# Patient Record
Sex: Female | Born: 1985 | Hispanic: No | Marital: Married | Smoking: Former smoker
Health system: Southern US, Community
[De-identification: ages and names within clinical notes are randomized; demographics above are authoritative.]

## PROBLEM LIST (undated history)

## (undated) DIAGNOSIS — A64 Unspecified sexually transmitted disease: Secondary | ICD-10-CM

## (undated) DIAGNOSIS — IMO0002 Reserved for concepts with insufficient information to code with codable children: Secondary | ICD-10-CM

## (undated) HISTORY — PX: INTRAUTERINE DEVICE (IUD) INSERTION: SHX5877

## (undated) HISTORY — DX: Reserved for concepts with insufficient information to code with codable children: IMO0002

## (undated) HISTORY — DX: Unspecified sexually transmitted disease: A64

---

## 2008-12-23 DIAGNOSIS — IMO0002 Reserved for concepts with insufficient information to code with codable children: Secondary | ICD-10-CM

## 2008-12-23 HISTORY — DX: Reserved for concepts with insufficient information to code with codable children: IMO0002

## 2009-02-10 HISTORY — PX: COLPOSCOPY: SHX161

## 2012-10-29 ENCOUNTER — Ambulatory Visit (INDEPENDENT_AMBULATORY_CARE_PROVIDER_SITE_OTHER): Payer: 59 | Admitting: Certified Nurse Midwife

## 2012-10-29 ENCOUNTER — Encounter: Payer: Self-pay | Admitting: Certified Nurse Midwife

## 2012-10-29 VITALS — BP 94/60 | HR 64 | Temp 98.1°F | Resp 16 | Ht 67.25 in | Wt 150.0 lb

## 2012-10-29 DIAGNOSIS — R350 Frequency of micturition: Secondary | ICD-10-CM

## 2012-10-29 LAB — POCT URINALYSIS DIPSTICK
Bilirubin, UA: NEGATIVE
Glucose, UA: NEGATIVE
Nitrite, UA: NEGATIVE
Protein, UA: NEGATIVE
Urobilinogen, UA: NEGATIVE
pH, UA: 5

## 2012-10-29 MED ORDER — PHENAZOPYRIDINE HCL 200 MG PO TABS: 200.0000 mg | ORAL_TABLET | Freq: Three times a day (TID) | ORAL | Status: DC | PRN

## 2012-10-29 NOTE — Progress Notes (Signed)
S:  27 y.o.Married Caucasian female presents with UTI  symptoms of urinary frequency, "white flakes in urine" for approximately 2 weeks. Pertinent negatives include The patient is having no constitutional symptoms, denying fever, chills, anorexia, or weight loss..  Symptoms not related to post coital. Current method of birth control Skyla IUD. Patient denies vaginal itching, burning or discharge. Patient has changed soap and is drinking 3 cups of caffeine daily and green tea, some water.  ROS: feels well  O alert, oriented to person, place, and time   healthy,  alert, with normal affect, well developed and well nourished  Skin warm and dry  CVAT negative bilateral  No CVA tenderness,   Pelvic exam: External genital area: BUS negative, bladder not tender, urethral meatus red, not tender Vagina: scant blood present, normal Cervix: Non tender, normal, IUD string noted Uterus: normal, non tender Adnexa: normal, non tender, no masses  POCT urine: negative    Assessment: Urinary frequency ? Related to caffeine intake or change in personal product.? urethritis Normal pelvic exam  Plan: Reviewed findings.Decrease caffeine intake and increase water intake. Lab: Urine culture Rx Pyridium see order  Rv prn

## 2012-10-31 ENCOUNTER — Telehealth: Payer: Self-pay

## 2012-10-31 NOTE — Telephone Encounter (Signed)
lmtcb

## 2012-10-31 NOTE — Telephone Encounter (Signed)
Patient notified of results.  See lab results °

## 2012-10-31 NOTE — Telephone Encounter (Signed)
Message copied by Eliezer Bottom on Wed Oct 31, 2012 11:43 AM ------      Message from: Verner Chol      Created: Wed Oct 31, 2012  8:37 AM       Notify patient urine culture negative      Patient status ------

## 2012-11-01 NOTE — Progress Notes (Signed)
Note reviewed, agree with plan.  Ruweyda Macknight, MD  

## 2013-05-20 ENCOUNTER — Ambulatory Visit (INDEPENDENT_AMBULATORY_CARE_PROVIDER_SITE_OTHER): Payer: 59 | Admitting: Certified Nurse Midwife

## 2013-05-20 ENCOUNTER — Encounter: Payer: Self-pay | Admitting: Certified Nurse Midwife

## 2013-05-20 VITALS — BP 114/62 | HR 70 | Resp 16 | Ht 67.25 in | Wt 161.0 lb

## 2013-05-20 DIAGNOSIS — Z0000 Encounter for general adult medical examination without abnormal findings: Secondary | ICD-10-CM

## 2013-05-20 DIAGNOSIS — Z124 Encounter for screening for malignant neoplasm of cervix: Secondary | ICD-10-CM

## 2013-05-20 DIAGNOSIS — Z01419 Encounter for gynecological examination (general) (routine) without abnormal findings: Secondary | ICD-10-CM

## 2013-05-20 LAB — POCT URINALYSIS DIPSTICK
BILIRUBIN UA: NEGATIVE
Blood, UA: NEGATIVE
GLUCOSE UA: NEGATIVE
Ketones, UA: NEGATIVE
Leukocytes, UA: NEGATIVE
NITRITE UA: NEGATIVE
Protein, UA: NEGATIVE
UROBILINOGEN UA: NEGATIVE
pH, UA: 5

## 2013-05-20 NOTE — Progress Notes (Signed)
28 y.o. [id number] Married Caucasian Fe here for annual exam. Periods scant to none with Skyla IUD. Happy with choice. Occasional cramping but no problems, relieved with OTC Motrin. No health issues, working on weight loss and exercise.    No LMP recorded. Patient is not currently having periods (Reason: IUD).          Sexually active: yes  The current method of family planning is IUD.    Exercising: yes  cardio & weight training Smoker:  no  Health Maintenance: Pap:  12-20-11 neg MMG:  none Colonoscopy: none BMD:   none TDaP: 2011 Labs: Poct urine-neg Self breast exam:done occ   reports that she has quit smoking. She does not have any smokeless tobacco history on file. She reports that she drinks about 3 - 5 ounces of alcohol per week. She reports that she does not use illicit drugs.  Past Medical History  Diagnosis Date  . 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 STD (sexually transmitted disease)     condyloma    Past Surgical History  Procedure Laterality Date  . Intrauterine device (iud) insertion      skyla inserted 01/02/12  . 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 Ascorbic Acid (VITAMIN C PO) Take by mouth daily.      Marland Kitchen. BIOTIN PO Take by mouth daily.      . cetirizine (ZYRTEC) 10 MG tablet Take 10 mg by mouth daily.      . Levonorgestrel (SKYLA) 13.5 MG IUD by Intrauterine route.       No current facility-administered medications for this visit.    Family History  Problem Relation Age of Onset  . Cancer Mother     cervical  . Hypertension Father   . Stroke Paternal Grandmother     ROS:  Pertinent items are noted in HPI.  Otherwise, a comprehensive ROS was negative.  Exam:   BP 114/62  Pulse 70  Resp 16  Ht 5' 7.25" (1.708 m)  Wt 161 lb (73.029 kg)  BMI 25.03 kg/m2 Height: 5' 7.25" (170.8 cm)  Ht Readings from Last 3 Encounters:  05/20/13 5' 7.25" (1.708 m)  10/29/12 5' 7.25" (1.708 m)     General appearance: alert, cooperative and appears stated age Head: Normocephalic, without obvious abnormality, atraumatic Neck: no adenopathy, supple, symmetrical, trachea midline and thyroid normal to inspection and palpation and non-palpable Lungs: clear to auscultation bilaterally Breasts: normal appearance, no masses or tenderness, No nipple retraction or dimpling, No nipple discharge or bleeding, No axillary or supraclavicular adenopathy Heart: regular rate and rhythm Abdomen: soft, non-tender; no masses,  no organomegaly Extremities: extremities normal, atraumatic, no cyanosis or edema Skin: Skin color, texture, turgor normal. No rashes or lesions Lymph nodes: Cervical, supraclavicular, and axillary nodes normal. No abnormal inguinal nodes palpated Neurologic: Grossly normal   Pelvic: External genitalia:  no lesions              Urethra:  normal appearing urethra with no masses, tenderness or lesions              Bartholin's and Skene's: normal                 Vagina: normal appearing vagina with normal color and discharge, no lesions              Cervix: normal, not tender, IUD string noted  Pap taken: yes Bimanual Exam:  Uterus:  normal size, contour, position, consistency, mobility, non-tender and anteflexed              Adnexa: normal adnexa and no mass, fullness, tenderness               Rectovaginal: Confirms               Anus:  deferred  A:  Well Woman with normal exam  Contraception Skyla IUD  P:   Reviewed health and wellness pertinent to exam  Aware of warning signs of IUD, due for removal 12/31/14.  Pap smear taken today with HPV reflex    counseled on breast self exam, adequate intake of calcium and vitamin D, diet and exercise  return annually or prn  An After Visit Summary was printed and given to the patient.

## 2013-05-20 NOTE — Patient Instructions (Signed)
General topics  Next pap or exam is  due in 1 year Take a Women's multivitamin Take 1200 mg. of calcium daily - prefer dietary If any concerns in interim to call back  Breast Self-Awareness Practicing breast self-awareness may pick up problems early, prevent significant medical complications, and possibly save your life. By practicing breast self-awareness, you can become familiar with how your breasts look and feel and if your breasts are changing. This allows you to notice changes early. It can also offer you some reassurance that your breast health is good. One way to learn what is normal for your breasts and whether your breasts are changing is to do a breast self-exam. If you find a lump or something that was not present in the past, it is best to contact your caregiver right away. Other findings that should be evaluated by your caregiver include nipple discharge, especially if it is bloody; skin changes or reddening; areas where the skin seems to be pulled in (retracted); or new lumps and bumps. Breast pain is seldom associated with cancer (malignancy), but should also be evaluated by a caregiver. BREAST SELF-EXAM The best time to examine your breasts is 5 7 days after your menstrual period is over.  ExitCare Patient Information 2013 [place].   Exercise to Stay Healthy Exercise helps you become and stay healthy. EXERCISE IDEAS AND TIPS Choose exercises that:  You enjoy.  Fit into your day. You do not need to exercise really hard to be healthy. You can do exercises at a slow or medium level and stay healthy. You can:  Stretch before and after working out.  Try yoga, Pilates, or tai chi.  Lift weights.  Walk fast, swim, jog, run, climb stairs, bicycle, dance, or rollerskate.  Take aerobic classes. Exercises that burn about 150 calories:  Running 1  miles in 15 minutes.  Playing volleyball for 45 to 60 minutes.  Washing and waxing a car for 45 to 60  minutes.  Playing touch football for 45 minutes.  Walking 1  miles in 35 minutes.  Pushing a stroller 1  miles in 30 minutes.  Playing basketball for 30 minutes.  Raking leaves for 30 minutes.  Bicycling 5 miles in 30 minutes.  Walking 2 miles in 30 minutes.  Dancing for 30 minutes.  Shoveling snow for 15 minutes.  Swimming laps for 20 minutes.  Walking up stairs for 15 minutes.  Bicycling 4 miles in 15 minutes.  Gardening for 30 to 45 minutes.  Jumping rope for 15 minutes.  Washing windows or floors for 45 to 60 minutes. Document Released: 02/05/2010 Document Revised: 03/28/2011 Document Reviewed: 02/05/2010 [facility] Patient Information 2013 [place].   Other topics ( that may be useful information):    Sexually Transmitted Disease Sexually transmitted disease (STD) refers to any infection that is passed from person to person during sexual activity. This may happen by way of saliva, semen, blood, vaginal mucus, or urine. Common STDs include:  Gonorrhea.  Chlamydia.  Syphilis.  HIV/AIDS.  Genital herpes.  Hepatitis B and C.  Trichomonas.  Human papillomavirus (HPV).  Pubic lice. CAUSES  An STD may be spread by bacteria, virus, or parasite. A person can get an STD by:  Sexual intercourse with an infected person.  Sharing sex toys with an infected person.  Sharing needles with an infected person.  Having intimate contact with the genitals, mouth, or rectal areas of an infected person. SYMPTOMS  Some people may not have any symptoms, but  they can still pass the infection to others. Different STDs have different symptoms. Symptoms include:  Painful or bloody urination.  Pain in the pelvis, abdomen, vagina, anus, throat, or eyes.  Skin rash, itching, irritation, growths, or sores (lesions). These usually occur in the genital or anal area.  Abnormal vaginal discharge.  Penile discharge in men.  Soft, flesh-colored skin growths in the  genital or anal area.  Fever.  Pain or bleeding during sexual intercourse.  Swollen glands in the groin area.  Yellow skin and eyes (jaundice). This is seen with hepatitis. DIAGNOSIS  To make a diagnosis, your caregiver may:  Take a medical history.  Perform a physical exam.  Take a specimen (culture) to be examined.  Examine a sample of discharge under a microscope.  Perform blood test TREATMENT   Chlamydia, gonorrhea, trichomonas, and syphilis can be cured with antibiotic medicine.  Genital herpes, hepatitis, and HIV can be treated, but not cured, with prescribed medicines. The medicines will lessen the symptoms.  Genital warts from HPV can be treated with medicine or by freezing, burning (electrocautery), or surgery. Warts may come back.  HPV is a virus and cannot be cured with medicine or surgery.However, abnormal areas may be followed very closely by your caregiver and may be removed from the cervix, vagina, or vulva through office procedures or surgery. If your diagnosis is confirmed, your recent sexual partners need treatment. This is true even if they are symptom-free or have a negative culture or evaluation. They should not have sex until their caregiver says it is okay. HOME CARE INSTRUCTIONS  All sexual partners should be informed, tested, and treated for all STDs.  Take your antibiotics as directed. Finish them even if you start to feel better.  Only take over-the-counter or prescription medicines for pain, discomfort, or fever as directed by your caregiver.  Rest.  Eat a balanced diet and drink enough fluids to keep your urine clear or pale yellow.  Do not have sex until treatment is completed and you have followed up with your caregiver. STDs should be checked after treatment.  Keep all follow-up appointments, Pap tests, and blood tests as directed by your caregiver.  Only use latex condoms and water-soluble lubricants during sexual activity. Do not use  petroleum jelly or oils.  Avoid alcohol and illegal drugs.  Get vaccinated for HPV and hepatitis. If you have not received these vaccines in the past, talk to your caregiver about whether one or both might be right for you.  Avoid risky sex practices that can break the skin. The only way to avoid getting an STD is to avoid all sexual activity.Latex condoms and dental dams (for oral sex) will help lessen the risk of getting an STD, but will not completely eliminate the risk. SEEK MEDICAL CARE IF:   You have a fever.  You have any new or worsening symptoms. Document Released: 03/26/2002 Document Revised: 03/28/2011 Document Reviewed: 04/02/2010 [facility] Patient Information 2013 [place].    Domestic Abuse You are being battered or abused if someone close to you hits, pushes, or physically hurts you in any way. You also are being abused if you are forced into activities. You are being sexually abused if you are forced to have sexual contact of any kind. You are being emotionally abused if you are made to feel worthless or if you are constantly threatened. It is important to remember that help is available. No one has the right to abuse you. PREVENTION OF FURTHER  ABUSE  Learn the warning signs of danger. This varies with situations but may include: the use of alcohol, threats, isolation from friends and family, or forced sexual contact. Leave if you feel that violence is going to occur.  If you are attacked or beaten, report it to the police so the abuse is documented. You do not have to press charges. The police can protect you while you or the attackers are leaving. Get the officer's name and badge number and a copy of the report.  Find someone you can trust and tell them what is happening to you: your caregiver, a nurse, clergy member, close friend or family member. Feeling ashamed is natural, but remember that you have done nothing wrong. No one deserves abuse. Document Released:  01/01/2000 Document Revised: 03/28/2011 Document Reviewed: 03/11/2010 Exit[employer] Patient Information 2013 [employer].    How Much is Too Much Alcohol? Drinking too much alcohol can cause injury, accidents, and health problems. These types of problems can include:   Car crashes.  Falls.  Family fighting (domestic violence).  Drowning.  Fights.  Injuries.  Burns.  Damage to certain organs.  Having a baby with birth defects. ONE DRINK CAN BE TOO MUCH WHEN YOU ARE:  Working.  Pregnant or breastfeeding.  Taking medicines. Ask your doctor.  Driving or planning to drive. If you or someone you know has a drinking problem, get help from a doctor.  Document Released: 10/30/2008 Document Revised: 03/28/2011 Document Reviewed: 10/30/2008 ExitCare Patient Information 2013 [employer].   Smoking Hazards Smoking cigarettes is extremely bad for your health. Tobacco smoke has over 200 known poisons in it. There are over 60 chemicals in tobacco smoke that cause cancer. Some of the chemicals found in cigarette smoke include:   Cyanide.  Benzene.  Formaldehyde.  Methanol (wood alcohol).  Acetylene (fuel used in welding torches).  Ammonia. Cigarette smoke also contains the poisonous gases nitrogen oxide and carbon monoxide.  Cigarette smokers have an increased risk of many serious medical problems and Smoking causes approximately:  90% of all lung cancer deaths in men.  80% of all lung cancer deaths in women.  90% of deaths from chronic obstructive lung disease. Compared with nonsmokers, smoking increases the risk of:  Coronary heart disease by 2 to 4 times.  Stroke by 2 to 4 times.  Men developing lung cancer by 23 times.  Women developing lung cancer by 13 times.  Dying from chronic obstructive lung diseases by 12 times.  . Smoking is the most preventable cause of death and disease in our society.  WHY IS SMOKING ADDICTIVE?  Nicotine is the chemical  agent in tobacco that is capable of causing addiction or dependence.  When you smoke and inhale, nicotine is absorbed rapidly into the bloodstream through your lungs. Nicotine absorbed through the lungs is capable of creating a powerful addiction. Both inhaled and non-inhaled nicotine may be addictive.  Addiction studies of cigarettes and spit tobacco show that addiction to nicotine occurs mainly during the teen years, when young people begin using tobacco products. WHAT ARE THE BENEFITS OF QUITTING?  There are many health benefits to quitting smoking.   Likelihood of developing cancer and heart disease decreases. Health improvements are seen almost immediately.  Blood pressure, pulse rate, and breathing patterns start returning to normal soon after quitting. QUITTING SMOKING   American Lung Association - 1-800-LUNGUSA  American Cancer Society - 1-800-ACS-2345 Document Released: 02/11/2004 Document Revised: 03/28/2011 Document Reviewed: 10/15/2008 ExitCare Patient Information 2013 ExitCare,   LL[employer].   Stress Management Stress is a state of physical or mental tension that often results from changes in your life or normal routine. Some common causes of stress are:  Death of a loved one.  Injuries or severe illnesses.  Getting fired or changing jobs.  Moving into a new home. Other causes may be:  Sexual problems.  Business or financial losses.  Taking on a large debt.  Regular conflict with someone at home or at work.  Constant tiredness from lack of sleep. It is not just bad things that are stressful. It may be stressful to:  Win the lottery.  Get married.  Buy a new car. The amount of stress that can be easily tolerated varies from person to person. Changes generally cause stress, regardless of the types of change. Too much stress can affect your health. It may lead to physical or emotional problems. Too little stress (boredom) may also become stressful. SUGGESTIONS TO  REDUCE STRESS:  Talk things over with your family and friends. It often is helpful to share your concerns and worries. If you feel your problem is serious, you may want to get help from a professional counselor.  Consider your problems one at a time instead of lumping them all together. Trying to take care of everything at once may seem impossible. List all the things you need to do and then start with the most important one. Set a goal to accomplish 2 or 3 things each day. If you expect to do too many in a single day you will naturally fail, causing you to feel even more stressed.  Do not use alcohol or drugs to relieve stress. Although you may feel better for a short time, they do not remove the problems that caused the stress. They can also be habit forming.  Exercise regularly - at least 3 times per week. Physical exercise can help to relieve that "uptight" feeling and will relax you.  The shortest distance between despair and hope is often a good night's sleep.  Go to bed and get up on time allowing yourself time for appointments without being rushed.  Take a short "time-out" period from any stressful situation that occurs during the day. Close your eyes and take some deep breaths. Starting with the muscles in your face, tense them, hold it for a few seconds, then relax. Repeat this with the muscles in your neck, shoulders, hand, stomach, back and legs.  Take good care of yourself. Eat a balanced diet and get plenty of rest.  Schedule time for having fun. Take a break from your daily routine to relax. HOME CARE INSTRUCTIONS   Call if you feel overwhelmed by your problems and feel you can no longer manage them on your own.  Return immediately if you feel like hurting yourself or someone else. Document Released: 06/29/2000 Document Revised: 03/28/2011 Document Reviewed: 02/19/2007 [facility] Patient Information 2013 [place].    It was nice to meet you today. Have a great  spring! Debbi

## 2013-05-21 NOTE — Progress Notes (Signed)
Reviewed personally.  M. Suzanne Floyed Masoud, MD.  

## 2013-05-23 LAB — IPS PAP TEST WITH REFLEX TO HPV

## 2013-11-18 ENCOUNTER — Encounter: Payer: Self-pay | Admitting: Certified Nurse Midwife

## 2014-05-30 ENCOUNTER — Telehealth: Payer: Self-pay | Admitting: Certified Nurse Midwife

## 2014-05-30 DIAGNOSIS — Z30433 Encounter for removal and reinsertion of intrauterine contraceptive device: Secondary | ICD-10-CM

## 2014-05-30 NOTE — Telephone Encounter (Signed)
Spoke with patient. Patient would like to schedule IUD removal and reinsertion at this time. [name] was inserted oCheri Fowlern 01/02/2012. Last aex on 05/20/2013 with [name] CNM. Next aex scheduled for 08/14/2014 with Dr.Silva.IUD removal/reinsertion appointment scheduled for 07/03/14 at 2pm with [name] CNM. Patient is agreeable to date and time. Pre procedure instructions given.  Motrin instructions given. Motrin=Advil=Ibuprofen, 800 mg one hour before appointment. Eat a meal and hydrate well before appointment. Orders placed for precert.   Routing to provider for final review. Patient agreeable to disposition. Patient aware provider will review message and nurse will return call with any additional instructions or change of disposition. Will close encounter.

## 2014-05-30 NOTE — Telephone Encounter (Signed)
Patient has a question regarding iud removal.

## 2014-06-02 NOTE — Telephone Encounter (Signed)
This patient has never had actual deliveries, she will need an MD for insertion.

## 2014-06-02 NOTE — Telephone Encounter (Signed)
Spoke with patient. Advised of message as seen below from [name] CNM. Patient is agreeable. Appointment rescheduled for 6/20 at 3pm with Dr.Silva. Patient is agreeable to date and time.  Cc: Dr.Silva  Routing to provider for final review. Patient agreeable to disposition. Patient aware provider will review message and nurse will return call with any additional instructions or change of disposition. Will close encounter.

## 2014-06-30 ENCOUNTER — Encounter: Payer: Self-pay | Admitting: Certified Nurse Midwife

## 2014-06-30 ENCOUNTER — Ambulatory Visit (INDEPENDENT_AMBULATORY_CARE_PROVIDER_SITE_OTHER): Payer: 59 | Admitting: Certified Nurse Midwife

## 2014-06-30 VITALS — BP 110/74 | HR 66 | Resp 14 | Ht 67.5 in | Wt 169.4 lb

## 2014-06-30 DIAGNOSIS — Z0000 Encounter for general adult medical examination without abnormal findings: Secondary | ICD-10-CM | POA: Diagnosis not present

## 2014-06-30 DIAGNOSIS — R0789 Other chest pain: Secondary | ICD-10-CM

## 2014-06-30 DIAGNOSIS — N390 Urinary tract infection, site not specified: Secondary | ICD-10-CM

## 2014-06-30 DIAGNOSIS — Z01419 Encounter for gynecological examination (general) (routine) without abnormal findings: Secondary | ICD-10-CM | POA: Diagnosis not present

## 2014-06-30 DIAGNOSIS — R079 Chest pain, unspecified: Secondary | ICD-10-CM

## 2014-06-30 LAB — POCT URINALYSIS DIPSTICK
BILIRUBIN UA: NEGATIVE
Blood, UA: NEGATIVE
GLUCOSE UA: NEGATIVE
Ketones, UA: NEGATIVE
NITRITE UA: NEGATIVE
PH UA: 5
Protein, UA: NEGATIVE
Urobilinogen, UA: NEGATIVE

## 2014-06-30 NOTE — Progress Notes (Signed)
Patient ID: Kristi Fuller, female   DOB: 1985-09-29, 29 y.o.   MRN: [id number] 29 y.o. [id number] Married  Caucasian Fe here for annual exam. Periods occasional color to scant. IUD working well. Removal scheduled for 07/07/14. Patient has been experiencing chest pain since she returned from business trip. She feels her heart race when she has the pain and pressure in her chest. Has noticed about twice weekly. Does drink caffeine twice daily, but that is not a change. Does not feel it is gas related. Does eat salad frequently, but passing gas does not change it. Lasts a few minutes and then feels fine. Was working out in gym and stopped has not changed when  occurs. Denies stress issues. Change of position seems to help. Denies nausea or faintness or SOB. Declines labs, sees Urgent care if needed. Denies UTI symptoms. No other health issues today.  No LMP recorded. Patient is not currently having periods (Reason: IUD).          Sexually active: Yes.    The current method of family planning is IUD--Skyla inserted 12/2011.     Exercising: Yes.    Cardio and weights. Smoker:  former  Health Maintenance: Pap:  05-20-13 normal, Pt. States 12/2008 had colposcopy for abnormal pap smear(LSIL), but biopsies were normal.  No treatment to cervix. MMG:  n/a Colonoscopy:  n/a BMD:   n/a TDaP:  2011 Labs: Hgb   reports that she has quit smoking. She does not have any smokeless tobacco history on file. She reports that she drinks about 3.6 - 6.0 oz of alcohol per week. She reports that she does not use illicit drugs.  Past Medical History  Diagnosis Date  . 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 STD (sexually transmitted disease)     condyloma    Past Surgical History  Procedure Laterality Date  . Intrauterine device (iud) insertion      skyla inserted 01/02/12  . 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 Ascorbic Acid (VITAMIN C PO) Take by mouth  daily.    Marland Kitchen BIOTIN PO Take by mouth daily.    . cetirizine (ZYRTEC) 10 MG tablet Take 10 mg by mouth daily.    . Levonorgestrel (SKYLA) 13.5 MG IUD by Intrauterine route.     No current facility-administered medications for this visit.    Family History  Problem Relation Age of Onset  . Cancer Mother     cervical  . Hypertension Father   . Stroke Paternal Grandmother     ROS:  Pertinent items are noted in HPI.  Otherwise, a comprehensive ROS was negative.  Exam:   BP 110/74 mmHg  Pulse 66  Resp 14  Ht 5' 7.5" (1.715 m)  Wt 169 lb 6.4 oz (76.839 kg)  BMI 26.12 kg/m2 Height: 5' 7.5" (171.5 cm) Ht Readings from Last 3 Encounters:  06/30/14 5' 7.5" (1.715 m)  05/20/13 5' 7.25" (1.708 m)  10/29/12 5' 7.25" (1.708 m)    General appearance: alert, cooperative and appears stated age Head: Normocephalic, without obvious abnormality, atraumatic Neck: no adenopathy, supple, symmetrical, trachea midline and thyroid normal to inspection and palpation Lungs: clear to auscultation bilaterally CVAT negative bilateral Breasts: normal appearance, no masses or tenderness, No nipple retraction or dimpling, No nipple discharge or bleeding, No axillary or supraclavicular adenopathy Heart: regular rate and rhythm, no murmurs noted, listened with patient reclining and sitting, no  change Abdomen: soft, non-tender; no masses,  no organomegaly, negative suprapubic Extremities: extremities normal, atraumatic, no cyanosis or edema Skin: Skin color, texture, turgor normal. No rashes or lesions Lymph nodes: Cervical, supraclavicular, and axillary nodes normal. No abnormal inguinal nodes palpated Neurologic: Grossly normal   Pelvic: External genitalia:  no lesions              Urethra:  normal appearing urethra with no masses, tenderness or lesions  Bladder and urethral meatus non tender              Bartholin's and Skene's: normal                 Vagina: normal appearing vagina with normal color  and discharge, no lesions              Cervix: normal, IUD string noted, non tender              Pap taken: No. Bimanual Exam:  Uterus:  normal size, contour, position, consistency, mobility, non-tender              Adnexa: normal adnexa and no mass, fullness, tenderness               Rectovaginal: Confirms               Anus:  normal appearance  Chaperone present: Yes  A:  Well Woman with normal exam  Contraception Skyla IUD removal and reinsertion scheduled  Non specific chest pain ? GI  R/O UTI  P:   Reviewed health and wellness pertinent to exam  Aware of appointment with IUD and aware early for replacement, will be out of the country when due.  Discussed feel evaluation with cardiology due to chest pain. Patient agreeable and relieved to be evaluated. Warning signs of chest pain given and need for evaluation in ER. Encouraged to decrease caffeine and salad use to see if change of the discomfort. Patient aware she will be called with referral information.  Lab: Urine micro  Pap smear not taken   counseled on breast self exam, adequate intake of calcium and vitamin D, diet and exercise, reviewed UTI symptoms and need to advise if occurs.  return annually or prn  An After Visit Summary was printed and given to the patient.

## 2014-06-30 NOTE — Patient Instructions (Signed)

## 2014-06-30 NOTE — Progress Notes (Signed)
Reviewed personally.  M. Suzanne Yue Flanigan, MD.  

## 2014-07-01 LAB — URINALYSIS, MICROSCOPIC ONLY
CASTS: NONE SEEN
Crystals: NONE SEEN
Squamous Epithelial / LPF: NONE SEEN

## 2014-07-01 LAB — HEMOGLOBIN, FINGERSTICK: HEMOGLOBIN, FINGERSTICK: 13.4 g/dL (ref 12.0–16.0)

## 2014-07-02 ENCOUNTER — Ambulatory Visit (INDEPENDENT_AMBULATORY_CARE_PROVIDER_SITE_OTHER): Payer: 59 | Admitting: Cardiology

## 2014-07-02 ENCOUNTER — Encounter: Payer: Self-pay | Admitting: Cardiology

## 2014-07-02 VITALS — BP 110/62 | HR 71 | Ht 67.0 in | Wt 167.6 lb

## 2014-07-02 DIAGNOSIS — M940 Chondrocostal junction syndrome [Tietze]: Secondary | ICD-10-CM

## 2014-07-02 NOTE — Patient Instructions (Signed)
Try anti-inflammatory therapy with Aleve or Ibuprofen and rest

## 2014-07-02 NOTE — Progress Notes (Signed)
Cardiology Office Note   Date:  07/02/2014   ID:  Kristi Fuller, DOB Mar 10, 1985, MRN [id number]  PCP:  No PCP Per Patient  Cardiologist:   Peter Swaziland, MD   Chief Complaint  Patient presents with  . Chest Pain    more of a pressure, occurs twice a week occasionally lasting a few minutes or sometimes a couple hours. patient reports no other complaints.      History of Present Illness: Kristi Fuller is a 29 y.o. female who presents for evaluation of chest pain. She is generally in excellent health. She reports over the past month having chest pain off and on. This started one month ago on a business trip to [place]. She developed pain in the left parasternal area. She described it as a feeling of swelling or fullness. It was moderate in intensity. It was worse with deep breathing, movement of her left arm to the right and with palpation. It has recurred since then but not as severe. She does exercise regularly and is in a weight lifting class at the gym but doesn't notice any recent strain. No fever, chills, or recent infection.     Past Medical History  Diagnosis Date  . 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 STD (sexually transmitted disease)     condyloma    Past Surgical History  Procedure Laterality Date  . Intrauterine device (iud) insertion      skyla inserted 01/02/12  . Colposcopy  02/10/09    CIN1     Current Outpatient Prescriptions  Medication Sig Dispense Refill  . Ascorbic Acid (VITAMIN C PO) Take by mouth daily.    Marland Kitchen BIOTIN PO Take by mouth daily.    . cetirizine (ZYRTEC) 10 MG tablet Take 10 mg by mouth daily.    . Levonorgestrel (SKYLA) 13.5 MG IUD by Intrauterine route.     No current facility-administered medications for this visit.    Allergies:   Amoxicillin    Social History:  The patient  reports that she has quit smoking. She does not have any smokeless tobacco history on file. She reports that she drinks about 3.6 - 6.0 oz of alcohol per week. She  reports that she does not use illicit drugs.   Family History:  The patient's family history includes Cancer in her mother; Hypertension in her father; Stroke in her paternal grandmother.    ROS:  Please see the history of present illness.   Otherwise, review of systems are positive for none.   All other systems are reviewed and negative.    PHYSICAL EXAM: VS:  BP 110/62 mmHg  Pulse 71  Ht  (1.702 m)  Wt 76.023 kg (167 lb 9.6 oz)  BMI 26.24 kg/m2 , BMI Body mass index is 26.24 kg/(m^2). GEN: Well nourished, well developed, in no acute distress HEENT: normal Neck: no JVD, carotid bruits, or masses Cardiac: RRR; no murmurs, rubs, or gallops,no edema. No chest wall pain to palpation. Respiratory:  clear to auscultation bilaterally, normal work of breathing GI: soft, nontender, nondistended, + BS MS: no deformity or atrophy Skin: warm and dry, no rash Neuro:  Strength and sensation are intact Psych: euthymic mood, full affect   EKG:  EKG is ordered today. The ekg ordered today demonstrates NSR with normal Ecg. I have personally reviewed and interpreted this study.      Recent Labs: No results found for requested labs within last 365 days.    Lipid  Panel No results found for: CHOL, TRIG, HDL, CHOLHDL, VLDL, LDLCALC, LDLDIRECT    Wt Readings from Last 3 Encounters:  07/02/14 76.023 kg (167 lb 9.6 oz)  06/30/14 76.839 kg (169 lb 6.4 oz)  05/20/13 73.029 kg (161 lb)      Other studies Reviewed: Additional studies/ records that were reviewed today include: none. Review of the above records demonstrates: N/A   ASSESSMENT AND PLAN:  1.  Chest pain. Exam and Ecg today are normal. History is consistent with musculoskeletal pain/costochondritis. Recommend rest and anti-inflammatory therapy with Aleve or ibuprofen until pain resolved. She is reassured.    Current medicines are reviewed at length with the patient today.  The patient does not have concerns regarding  medicines.  The following changes have been made:  See above.  Labs/ tests ordered today include:  Orders Placed This Encounter  Procedures  . EKG 12-Lead     Disposition:   Prn   Signed, Peter Swaziland, MDFACC  07/02/2014 5:01 PM    [facility] [address] [place], [place], [address] Phone [phone], Fax [phone]

## 2014-07-03 ENCOUNTER — Ambulatory Visit: Payer: Self-pay | Admitting: Certified Nurse Midwife

## 2014-07-07 ENCOUNTER — Ambulatory Visit (INDEPENDENT_AMBULATORY_CARE_PROVIDER_SITE_OTHER): Payer: 59 | Admitting: Obstetrics and Gynecology

## 2014-07-07 ENCOUNTER — Encounter: Payer: Self-pay | Admitting: Obstetrics and Gynecology

## 2014-07-07 DIAGNOSIS — Z3009 Encounter for other general counseling and advice on contraception: Secondary | ICD-10-CM | POA: Diagnosis not present

## 2014-07-07 NOTE — Progress Notes (Signed)
Patient ID: Kristi Fuller, female   DOB: 06-01-1985, 29 y.o.   MRN: [id number] GYNECOLOGY  VISIT   HPI: 29 y.o.   Married  Caucasian  female   G2P0020 with No LMP recorded. Patient is not currently having periods (Reason: IUD).   here for  IUD [name]) removal and new IUD inserted.  Skyla inserted 12/13. No problems with insertion of prior IUD.  No problems with IUD other than some manageable random cramping.  Takes ASA if needed.  No menses.  Traveling to [place] in December 2016. Wants IUD changed early.   Took Ibuprofen 600 mg prior to visit today.  Did not take any Cytotec.  GYNECOLOGIC HISTORY: No LMP recorded. Patient is not currently having periods (Reason: IUD). Contraception:IUD Menopausal hormone therapy: N/A Last mammogram: N/A Last pap smear: 05-20-13 WNL         OB History    Gravida Para Term Preterm AB TAB SAB Ectopic Multiple Living   2 0 0  2 2    0         Patient Active Problem List   Diagnosis Date Noted  . Costochondritis 07/02/2014    Past Medical History  Diagnosis Date  . 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 STD (sexually transmitted disease)     condyloma    Past Surgical History  Procedure Laterality Date  . Intrauterine device (iud) insertion      skyla inserted 01/02/12  . 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 Ascorbic Acid (VITAMIN C PO) Take by mouth daily.    Marland Kitchen BIOTIN PO Take by mouth daily.    . cetirizine (ZYRTEC) 10 MG tablet Take 10 mg by mouth daily.    . Levonorgestrel (SKYLA) 13.5 MG IUD by Intrauterine route.     No current facility-administered medications for this visit.     ALLERGIES: Amoxicillin  Family History  Problem Relation Age of Onset  . Cancer Mother     cervical  . Hypertension Father   . Stroke Paternal Grandmother     History   Social History  . Marital Status: Married    Spouse Name: N/A  . Number of Children: 0  . Years of  Education: N/A   Occupational History  . tax accountant    Social History Main Topics  . Smoking status: Former Games developer  . Smokeless tobacco: Never Used  . Alcohol Use: 3.6 - 6.0 oz/week    6-10 Standard drinks or equivalent per week  . Drug Use: No  . Sexual Activity:    Partners: Male    Birth Control/ Protection: IUD     Comment: Skyla--inserted 12/2011   Other Topics Concern  . Not on file   Social History Narrative    ROS:  Pertinent items are noted in HPI.  PHYSICAL EXAMINATION:    BP 110/68 mmHg  Pulse 68  Resp 16  Wt 168 lb (76.204 kg)    General appearance: alert, cooperative and appears stated age.  Became tearful during visit.   Pelvic: External genitalia:  no lesions              Urethra:  normal appearing urethra with no masses, tenderness or lesions              Bartholins and Skenes: normal              Vagina: normal appearing vagina with  normal color and discharge, no lesions.  Pederson speculum used.              Cervix: no lesions and IUD strings present.              Bimanual Exam:  Uterus:  Exam limited by patient's discomfort and difficulty to relax.  Stated she had pain as soon as initial contact of the hymenal region with bimanual exam.  Introitus seems virginal and tight for 2 digit exam.              Adnexa: no mass, fullness, tenderness and Exam limited.              IUD removal and reinsertion not performed today.   Chaperone was present for exam.  ASSESSMENT  Skyla IUD patient.  Anxiety and pain with examination today.  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   PLAN  Procedure aborted due to patient's anxiety and pain.  I reassured patient that it was OK not to proceed with the IUD exchange today.  She stated that she was just here and did not have difficulty with exam at that time.  I reassured her that her IUD was functioning well still to provide contraception for her.  We discussed other contraceptive options briefly - Nexplanon, Depo  Provera, pills, etc. I recommended that she return to see her usual provider, Sara Chu to re-evaluate her contraceptive choices.  I am happy to help in the future if patient desires.  If she chooses to try again for IUD exchange, I would recommend Cytotec and Xanax or Ativan pre-procedure.  An After Visit Summary was printed and given to the patient.  __15____ minutes face to face time of which over 50% was spent in counseling.

## 2014-07-25 ENCOUNTER — Telehealth: Payer: Self-pay | Admitting: Obstetrics and Gynecology

## 2014-07-25 NOTE — Telephone Encounter (Signed)
Thank you for the update.  Encounter closed. 

## 2014-07-25 NOTE — Telephone Encounter (Signed)
Patient canceled appointment to discuss birth control options and does not wish to reschedule right now

## 2014-07-30 ENCOUNTER — Ambulatory Visit: Payer: 59 | Admitting: Certified Nurse Midwife

## 2014-08-14 ENCOUNTER — Ambulatory Visit: Payer: Self-pay | Admitting: Obstetrics and Gynecology

## 2014-11-27 ENCOUNTER — Telehealth: Payer: Self-pay | Admitting: Certified Nurse Midwife

## 2014-11-27 DIAGNOSIS — Z30433 Encounter for removal and reinsertion of intrauterine contraceptive device: Secondary | ICD-10-CM

## 2014-11-27 MED ORDER — MISOPROSTOL 200 MCG PO TABS: ORAL_TABLET | ORAL | Status: DC

## 2014-11-27 NOTE — Telephone Encounter (Signed)
I would prefer that the patient come in to the office one hour before her appointment time to review her health history with the nurse, sign a new consent form, and receive the Ativan after this has been done.  She has not been seen here for 5 months.  If she is not able to do this, then make an appointment for a talking visit on a separate day prior to the insertion.  I could give her an Rx for Ativan at that time.   Thanks.

## 2014-11-27 NOTE — Telephone Encounter (Signed)
Patient would like to have her iud removed and replaced.

## 2014-11-27 NOTE — Telephone Encounter (Signed)
Spoke with patient. Patient would like to schedule her IUD removal and reinsertion. Christean GriefSkyla was placed 12/2011. Attempted to have IUD removed in 06/2014 with Dr.Silva but removal was aborted due to anxiety and pain. Per OV note on 07/07/2014 it is recommended that the patient be pre-treated with Cytotec and Ativan or Xanax. Patient would like to proceed. Appointment scheduled for 12/08/2014 at 3 pm with Dr.Silva. Cytotec instructions given.1 tablet PV night before the procedure.  1 tablet PV the morning of the procedure. Cytotec 200 mcg #2 0RF sent to pharmacy on file. Advised she will need to arrive to the office one hour early for pre-medication for anxiety. Will need a driver. Patient is requesting Rx be sent to the pharmacy so that she may not have to arrive at the office 1 hour early. Advised I will need to speak with Dr.Silva regarding this and return phone call. Pre procedure instructions given.  Motrin instructions given. Motrin=Advil=Ibuprofen, 800 mg one hour before appointment. Eat a meal and hydrate well before appointment.   Dr.Silva, patient is requesting pre-medication for anxiety be sent to her pharmacy. Please review and advise.

## 2014-11-28 NOTE — Telephone Encounter (Signed)
Spoke with patient and message from Dr. Edward JollySilva discussed.  Patient agreeable and will come one hour early for appointment.  Cytotec has been sent in and patient verbalizes understanding of instructions.  Routing to provider for final review. Patient agreeable to disposition. Will close encounter.     cc Claudette LawsAmanda Dixon

## 2014-12-01 NOTE — Telephone Encounter (Signed)
Orders placed for IUD removal and replacement.

## 2014-12-01 NOTE — Addendum Note (Signed)
Addended by: Joeseph AmorFAST, Loralei Radcliffe L on: 12/01/2014 08:26 AM   Modules accepted: Orders

## 2014-12-03 ENCOUNTER — Telehealth: Payer: Self-pay | Admitting: Obstetrics and Gynecology

## 2014-12-03 NOTE — Telephone Encounter (Signed)
Patient says she is waiting on a call to find out how much she would have to pay on Monday for her procedure.

## 2014-12-07 ENCOUNTER — Ambulatory Visit (INDEPENDENT_AMBULATORY_CARE_PROVIDER_SITE_OTHER): Payer: BLUE CROSS/BLUE SHIELD | Admitting: Physician Assistant

## 2014-12-07 VITALS — BP 118/74 | HR 74 | Temp 98.1°F | Resp 16 | Ht 67.5 in | Wt 178.0 lb

## 2014-12-07 DIAGNOSIS — Z23 Encounter for immunization: Secondary | ICD-10-CM

## 2014-12-07 NOTE — Progress Notes (Signed)
   Subjective:   Patient ID: Kristi Fuller, female     DOB: [birth year], 29 y.o.    MRN: [id number]  PCP: No PCP Per Patient  Chief Complaint  Patient presents with  . Immunizations    hep A    HPI  Presents for Hepatitis A vaccination in preparation for travel to [place] next month for a friend's wedding. Is currently taking Vivotif. Has prescriptions for malaria prophylaxis and azithromycin (in case of illness while traveling.   Prior to Admission medications   Medication Sig Start Date End Date Taking? Authorizing Provider  Ascorbic Acid (VITAMIN C PO) Take by mouth daily.   Yes Historical Provider, MD  BIOTIN PO Take by mouth daily.   Yes Historical Provider, MD  cetirizine (ZYRTEC) 10 MG tablet Take 10 mg by mouth daily.   Yes Historical Provider, MD  Levonorgestrel (SKYLA) 13.5 MG IUD by Intrauterine route.   Yes Historical Provider, MD  misoprostol (CYTOTEC) 200 MCG tablet 1 tablet PV night before the procedure.1 tablet PV the morning of the procedure. 11/27/14  Yes Brook Rosalin HawkingE Amundson C Silva, MD  atovaquone-proguanil (MALARONE) 250-100 MG TABS tablet  11/29/14   Historical Provider, MD  azithromycin (ZITHROMAX) 500 MG tablet  11/29/14   Historical Provider, MD     Allergies  Allergen Reactions  . Amoxicillin Hives     Patient Active Problem List   Diagnosis Date Noted  . Costochondritis 07/02/2014     Family History  Problem Relation Age of Onset  . Cancer Mother     cervical  . Hypertension Father   . Stroke Paternal Grandmother      Social History   Social History  . Marital Status: Married    Spouse Name: Greta Doomndrew Smith  . Number of Children: 0  . Years of Education: N/A   Occupational History  . tax accountant    Social History Main Topics  . Smoking status: Former Games developermoker  . Smokeless tobacco: Former NeurosurgeonUser    Quit date: 12/06/2005  . Alcohol Use: 3.6 - 6.0 oz/week    6-10 Standard drinks or equivalent per week  . Drug Use: No  . Sexual Activity:      Partners: Male    Birth Control/ Protection: IUD     Comment: Skyla--inserted 12/2011   Other Topics Concern  . Not on file   Social History Narrative   Lives with her husband        Review of Systems       Objective:  Physical Exam  Constitutional: She is oriented to person, place, and time. She appears well-developed and well-nourished. She is active and cooperative. No distress.  BP 118/74 mmHg  Pulse 74  Temp(Src) 98.1 F (36.7 C) (Oral)  Resp 16  Ht 5' 7.5" (1.715 m)  Wt 178 lb (80.74 kg)  BMI 27.45 kg/m2  SpO2 98%   Eyes: Conjunctivae are normal.  Pulmonary/Chest: Effort normal.  Neurological: She is alert and oriented to person, place, and time.  Psychiatric: She has a normal mood and affect. Her speech is normal and behavior is normal.             Assessment & Plan:  1. Need for hepatitis A vaccination Dose #2 in 6-12 months - Hepatitis A vaccine adult IM  2. Need for influenza vaccination - Flu Vaccine QUAD 36+ mos IM   Fernande Brashelle S. Chantavia Bazzle, PA-C Physician Assistant-Certified [facility]

## 2014-12-07 NOTE — Patient Instructions (Addendum)
The second dose of Hep A vaccine is due in 6-12 months. ENJOY [place] and the celebration!

## 2014-12-08 ENCOUNTER — Encounter: Payer: Self-pay | Admitting: Obstetrics and Gynecology

## 2014-12-08 ENCOUNTER — Ambulatory Visit (INDEPENDENT_AMBULATORY_CARE_PROVIDER_SITE_OTHER): Payer: BLUE CROSS/BLUE SHIELD | Admitting: Obstetrics and Gynecology

## 2014-12-08 DIAGNOSIS — Z30433 Encounter for removal and reinsertion of intrauterine contraceptive device: Secondary | ICD-10-CM | POA: Diagnosis not present

## 2014-12-08 NOTE — Progress Notes (Signed)
Patient ID: Kristi Fuller, female   DOB: 05/09/1985, 29 y.o.   MRN: [id number] GYNECOLOGY  VISIT   HPI: 29 y.o.   Married  Caucasian  female   G2P0020 with No LMP recorded. Patient is not currently having periods (Reason: IUD).   [facility].    No menstrual cycles.   No questions today.   Took Ativan 1 mg and Ibuprofen 800 mg here today.   Traveling to [place] in December for a friend's wedding.   GYNECOLOGIC HISTORY: No LMP recorded. Patient is not currently having periods (Reason: IUD). Contraception: Skyla IUD--inserted 12/2011 Menopausal hormone therapy: n/a Last mammogram: n/a Last pap smear: 05-20-13 Neg        OB History    Gravida Para Term Preterm AB TAB SAB Ectopic Multiple Living   2 0 0  2 2    0         Patient Active Problem List   Diagnosis Date Noted  . Costochondritis 07/02/2014    Past Medical History  Diagnosis Date  . 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 STD (sexually transmitted disease)     condyloma    Past Surgical History  Procedure Laterality Date  . Intrauterine device (iud) insertion      skyla inserted 01/02/12  . 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 Ascorbic Acid (VITAMIN C PO) Take by mouth daily.    Marland Kitchen. BIOTIN PO Take by mouth daily.    . cetirizine (ZYRTEC) 10 MG tablet Take 10 mg by mouth daily.    . Levonorgestrel (SKYLA) 13.5 MG IUD by Intrauterine route.    . misoprostol (CYTOTEC) 200 MCG tablet 1 tablet PV night before the procedure.1 tablet PV the morning of the procedure. 2 tablet 0  . atovaquone-proguanil (MALARONE) 250-100 MG TABS tablet     . azithromycin (ZITHROMAX) 500 MG tablet      No current facility-administered medications for this visit.     ALLERGIES: Amoxicillin  Family History  Problem Relation Age of Onset  . Cancer Mother     cervical  . Hypertension Father   . Stroke Paternal Grandmother     Social  History   Social History  . Marital Status: Married    Spouse Name: Greta Doomndrew Smith  . Number of Children: 0  . Years of Education: N/A   Occupational History  . tax accountant    Social History Main Topics  . Smoking status: Former Games developermoker  . Smokeless tobacco: Former NeurosurgeonUser    Quit date: 12/06/2005  . Alcohol Use: 3.6 - 6.0 oz/week    6-10 Standard drinks or equivalent per week  . Drug Use: No  . Sexual Activity:    Partners: Male    Birth Control/ Protection: IUD     Comment: Skyla--inserted 12/2011   Other Topics Concern  . Not on file   Social History Narrative   Lives with her husband    ROS:  Pertinent items are noted in HPI.  PHYSICAL EXAMINATION:    BP 120/80 mmHg  Pulse 80  Ht 5' 7.5" (1.715 m)  Wt 179 lb 12.8 oz (81.557 kg)  BMI 27.73 kg/m2    General appearance: alert, cooperative and appears stated age   IUD removal and insertion of new Skyla IUD. Skyla IUD - lot number [id number], expiration 02/17. Consent for procedure prior to taking Ativan 1 mg  po.  Also received Ibuprofen 800 mg po. Speculum placed in vagina.  Hibiclens sterile prep.  Old IUD removed intact and discarded. Paracervical block with 10 cc 1% lidocaine - lot number [id number], expiration 01/18/15. Tenaculum to anterior cervical lip.  Uterus sounded to 6.5 cm.  Skyla IUD placed without difficulty.  Strings trimmed.  Speculum removed.  Minimal EBL. No complications.  Bimanual exam - Uterus small and nontender.  IUD strings palpable.  No adnexal masses or tenderness.  Chaperone was present for exam.  ASSESSMENT  IUD removal and insertion of new SKYLA IUD.  PLAN  Instructions and precautions given.  Follow up in about 10 days, prior to travel to [place]. No unprotected intercourse until after seen for follow up visit.  Has a driver to take her home today.    An After Visit Summary was printed and given to the patient.

## 2014-12-08 NOTE — Patient Instructions (Signed)

## 2014-12-19 ENCOUNTER — Ambulatory Visit (INDEPENDENT_AMBULATORY_CARE_PROVIDER_SITE_OTHER): Payer: BLUE CROSS/BLUE SHIELD | Admitting: Obstetrics and Gynecology

## 2014-12-19 ENCOUNTER — Encounter: Payer: Self-pay | Admitting: Obstetrics and Gynecology

## 2014-12-19 VITALS — BP 120/60 | HR 68 | Resp 18 | Ht 67.5 in | Wt 181.0 lb

## 2014-12-19 DIAGNOSIS — Z30431 Encounter for routine checking of intrauterine contraceptive device: Secondary | ICD-10-CM | POA: Diagnosis not present

## 2014-12-19 NOTE — Progress Notes (Signed)
GYNECOLOGY  VISIT   HPI: 29 y.o.   Married  Caucasian  female   G2P0020 with No LMP recorded. Patient is not currently having periods (Reason: IUD).   here for IUD Check. IUD removal and reinsertion on 12/08/14 - Skyla IUD. Had this done early due to upcoming international travel plans.   Bled for 2 days after IUD insertion.  Random cramping once.  Not sexually active since IUD placed.   GYNECOLOGIC HISTORY: No LMP recorded. Patient is not currently having periods (Reason: IUD). Contraception: IUD  Menopausal hormone therapy: None Last mammogram: None Last pap smear: 05/20/13 Neg        OB History    Gravida Para Term Preterm AB TAB SAB Ectopic Multiple Living   2 0 0  2 2    0         Patient Active Problem List   Diagnosis Date Noted  . Costochondritis 07/02/2014    Past Medical History  Diagnosis Date  . 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 STD (sexually transmitted disease)     condyloma    Past Surgical History  Procedure Laterality Date  . Intrauterine device (iud) insertion      skyla inserted 01/02/12  . 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 Ascorbic Acid (VITAMIN C PO) Take by mouth daily.    Marland Kitchen. atovaquone-proguanil (MALARONE) 250-100 MG TABS tablet     . BIOTIN PO Take by mouth daily.    . cetirizine (ZYRTEC) 10 MG tablet Take 10 mg by mouth daily.    . Levonorgestrel (SKYLA) 13.5 MG IUD by Intrauterine route.    Marland Kitchen. azithromycin (ZITHROMAX) 500 MG tablet      No current facility-administered medications for this visit.     ALLERGIES: Amoxicillin  Family History  Problem Relation Age of Onset  . Cancer Mother     cervical  . Hypertension Father   . Stroke Paternal Grandmother     Social History   Social History  . Marital Status: Married    Spouse Name: Greta Doomndrew Smith  . Number of Children: 0  . Years of Education: N/A   Occupational History  . tax accountant    Social  History Main Topics  . Smoking status: Former Games developermoker  . Smokeless tobacco: Former NeurosurgeonUser    Quit date: 12/06/2005  . Alcohol Use: 3.6 - 6.0 oz/week    6-10 Standard drinks or equivalent per week  . Drug Use: No  . Sexual Activity:    Partners: Male    Birth Control/ Protection: IUD     Comment: Skyla--inserted 12/2011   Other Topics Concern  . Not on file   Social History Narrative   Lives with her husband    ROS:  Pertinent items are noted in HPI.  PHYSICAL EXAMINATION:    BP 120/60 mmHg  Pulse 68  Resp 18  Ht 5' 7.5" (1.715 m)  Wt 181 lb (82.101 kg)  BMI 27.91 kg/m2    General appearance: alert, cooperative and appears stated age   Pelvic: External genitalia:  no lesions              Urethra:  normal appearing urethra with no masses, tenderness or lesions              Bartholins and Skenes: normal  Vagina: normal appearing vagina with normal color and discharge, no lesions              Cervix: no lesions and IUD strings present.               Bimanual Exam:  Uterus:  normal size, contour, position, consistency, mobility, non-tender              Adnexa: normal adnexa and no mass, fullness, tenderness            Chaperone was present for exam.  ASSESSMENT  Skyla IUD patient.   PLAN  Counseled regarding bleeding patterns with Skyla IUD and reassurance regarding providing contraception.  Follow up for annual exam and prn.  Removal of IUD 36 months after placement.    An After Visit Summary was printed and given to the patient.  ___15__ minutes face to face time of which over 50% was spent in counseling.

## 2014-12-21 ENCOUNTER — Encounter: Payer: Self-pay | Admitting: Obstetrics and Gynecology

## 2015-07-01 ENCOUNTER — Ambulatory Visit: Payer: 59 | Admitting: Certified Nurse Midwife

## 2015-07-10 ENCOUNTER — Ambulatory Visit (INDEPENDENT_AMBULATORY_CARE_PROVIDER_SITE_OTHER): Payer: BLUE CROSS/BLUE SHIELD | Admitting: Certified Nurse Midwife

## 2015-07-10 ENCOUNTER — Encounter: Payer: Self-pay | Admitting: Certified Nurse Midwife

## 2015-07-10 VITALS — BP 126/70 | HR 76 | Resp 16 | Ht 67.25 in | Wt 162.0 lb

## 2015-07-10 DIAGNOSIS — Z01419 Encounter for gynecological examination (general) (routine) without abnormal findings: Secondary | ICD-10-CM | POA: Diagnosis not present

## 2015-07-10 DIAGNOSIS — Z124 Encounter for screening for malignant neoplasm of cervix: Secondary | ICD-10-CM

## 2015-07-10 DIAGNOSIS — Z0000 Encounter for general adult medical examination without abnormal findings: Secondary | ICD-10-CM | POA: Diagnosis not present

## 2015-07-10 LAB — POCT URINALYSIS DIPSTICK
Bilirubin, UA: NEGATIVE
GLUCOSE UA: NEGATIVE
Ketones, UA: NEGATIVE
LEUKOCYTES UA: NEGATIVE
NITRITE UA: NEGATIVE
Protein, UA: NEGATIVE
RBC UA: NEGATIVE
UROBILINOGEN UA: NEGATIVE
pH, UA: 5

## 2015-07-10 NOTE — Patient Instructions (Signed)

## 2015-07-10 NOTE — Progress Notes (Signed)
Reviewed personally.  M. Suzanne Albirtha Grinage, MD.  

## 2015-07-10 NOTE — Progress Notes (Signed)
30 y.o. 52P0020 Married  Caucasian Fe here for annual exam. Periods scant to none with SKyla IUD. Happy with choice. Took trip to [place] and did not finish Hep A vaccination, plans to complete. Saw cardiologist after episode at last visit with SOB and chest pain. Per patient all normal no issues with testing. No health issues today. Screening labs requested.  No LMP recorded. Patient is not currently having periods (Reason: IUD).          Sexually active: Yes.    The current method of family planning is IUD.    Exercising: Yes.    pilates Smoker:  no  Health Maintenance: Pap:  05/20/13 pap smear negative; see EPIC MMG:  n/a Colonoscopy:  n/a BMD:   n/a TDaP:  2011 Hep C and HIV: doing today Labs: Hgb:   Urine today: negative   reports that she has quit smoking. She quit smokeless tobacco use about 9 years ago. She reports that she drinks about 3.6 - 6.0 oz of alcohol per week. She reports that she does not use illicit drugs.  Past Medical History  Diagnosis Date  . 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 STD (sexually transmitted disease)     condyloma    Past Surgical History  Procedure Laterality Date  . Intrauterine device (iud) insertion      skyla inserted 01/02/12  . 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 Ascorbic Acid (VITAMIN C PO) Take by mouth daily.    Marland Kitchen. atovaquone-proguanil (MALARONE) 250-100 MG TABS tablet     . azithromycin (ZITHROMAX) 500 MG tablet     . BIOTIN PO Take by mouth daily.    . cetirizine (ZYRTEC) 10 MG tablet Take 10 mg by mouth daily.    . Levonorgestrel (SKYLA) 13.5 MG IUD by Intrauterine route.     No current facility-administered medications for this visit.    Family History  Problem Relation Age of Onset  . Cancer Mother     cervical  . Hypertension Father   . Stroke Paternal Grandmother     ROS:  Pertinent items are noted in HPI.  Otherwise, a comprehensive ROS was  negative.  Exam:   There were no vitals taken for this visit.   Ht Readings from Last 3 Encounters:  12/19/14 5' 7.5" (1.715 m)  12/08/14 5' 7.5" (1.715 m)  12/07/14 5' 7.5" (1.715 m)    General appearance: alert, cooperative and appears stated age Head: Normocephalic, without obvious abnormality, atraumatic Neck: no adenopathy, supple, symmetrical, trachea midline and thyroid normal to inspection and palpation Lungs: clear to auscultation bilaterally Breasts: normal appearance, no masses or tenderness, No nipple retraction or dimpling, No nipple discharge or bleeding, No axillary or supraclavicular adenopathy, Taught monthly breast self examination Heart: regular rate and rhythm Abdomen: soft, non-tender; no masses,  no organomegaly Extremities: extremities normal, atraumatic, no cyanosis or edema Skin: Skin color, texture, turgor normal. No rashes or lesions Lymph nodes: Cervical, supraclavicular, and axillary nodes normal. No abnormal inguinal nodes palpated Neurologic: Grossly normal   Pelvic: External genitalia:  no lesions              Urethra:  normal appearing urethra with no masses, tenderness or lesions              Bartholin's and Skene's: normal  Vagina: normal appearing vagina with normal color and discharge, no lesions              Cervix: no cervical motion tenderness, no lesions and nulliparous appearance              Pap taken: Yes.   Bimanual Exam:  Uterus:  normal size, contour, position, consistency, mobility, non-tender and anteverted              Adnexa: normal adnexa and no mass, fullness, tenderness               Rectovaginal: Confirms               Anus:  normal sphincter tone, no lesions  Chaperone present: yes  A:  Well Woman with normal exam  Contraception Skyla IUD due for removal 01/2018  Screening labs  Hep A vaccine in progress.  P:   Reviewed health and wellness pertinent to exam  Reviewed warning signs and symptoms with IUD  and need to advise  Lab: HIV,Hep C  Pap smear as above with HPVHR   counseled on breast self exam, adequate intake of calcium and vitamin D, diet and exercise  return annually or prn  An After Visit Summary was printed and given to the patient.

## 2015-07-15 NOTE — Addendum Note (Signed)
Addended by: Verner CholLEONARD, Britainy Kozub S on: 07/15/2015 12:12 PM   Modules accepted: Orders, SmartSet

## 2015-07-22 LAB — IPS PAP TEST WITH HPV

## 2016-07-13 ENCOUNTER — Ambulatory Visit (INDEPENDENT_AMBULATORY_CARE_PROVIDER_SITE_OTHER): Payer: BLUE CROSS/BLUE SHIELD | Admitting: Certified Nurse Midwife

## 2016-07-13 ENCOUNTER — Encounter: Payer: Self-pay | Admitting: Certified Nurse Midwife

## 2016-07-13 VITALS — BP 110/62 | HR 64 | Resp 16 | Ht 67.25 in | Wt 160.0 lb

## 2016-07-13 DIAGNOSIS — Z01419 Encounter for gynecological examination (general) (routine) without abnormal findings: Secondary | ICD-10-CM

## 2016-07-13 DIAGNOSIS — Z30431 Encounter for routine checking of intrauterine contraceptive device: Secondary | ICD-10-CM

## 2016-07-13 DIAGNOSIS — R102 Pelvic and perineal pain: Secondary | ICD-10-CM | POA: Diagnosis not present

## 2016-07-13 NOTE — Patient Instructions (Signed)

## 2016-07-13 NOTE — Progress Notes (Signed)
31 y.o. 802P0020 Married  Caucasian Fe here for annual exam. Periods occasional with IUD, had light one this month with some cramping. Happy with choice of IUD Skyla.. Still having pain with sexual activity on entrance only. Not using any lubricant or changing position to see if this resolves. Slight increase in discharge, no itching or burning. Some back pain, wondered if this from pelvic area. Stands all day at work with minimal sitting also. Sees Urgent Care if needed. No other health issues today.  Patient's last menstrual period was 07/04/2016 (exact date).          Sexually active: Yes.    The current method of family planning is IUD.    Exercising: Yes.   purre barre  Smoker:  no  Health Maintenance: Pap:  07-10-15 neg HPV HR neg History of Abnormal Pap: yes MMG:  none Self Breast exams: no Colonoscopy:  none BMD:  none TDaP:  2011 Shingles: no Pneumonia: no Hep C and HIV: not done Labs: none   reports that she has quit smoking. She quit smokeless tobacco use about 10 years ago. She reports that she drinks about 6.0 oz of alcohol per week . She reports that she does not use drugs.  Past Medical History:  Diagnosis Date  . 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 STD (sexually transmitted disease)    condyloma    Past Surgical History:  Procedure Laterality Date  . COLPOSCOPY  02/10/09   CIN1  . INTRAUTERINE DEVICE (IUD) INSERTION     skyla inserted 01/02/12, removed & re-inserted 12-08-14    Current Outpatient Prescriptions  Medication Sig Dispense Refill  . Ascorbic Acid (VITAMIN C PO) Take by mouth daily.    Marland Kitchen. BIOTIN PO Take by mouth daily.    . cetirizine (ZYRTEC) 10 MG tablet Take 10 mg by mouth daily.    . Levonorgestrel (SKYLA) 13.5 MG IUD by Intrauterine route.    . TURMERIC PO Take by mouth daily.     No current facility-administered medications for this visit.     Family History  Problem Relation Age of Onset  . Cancer Mother        cervical  .  Hypertension Father   . Stroke Paternal Grandmother     ROS:  Pertinent items are noted in HPI.  Otherwise, a comprehensive ROS was negative.  Exam:   BP 110/62   Pulse 64   Resp 16   Ht 5' 7.25" (1.708 m)   Wt 160 lb (72.6 kg)   LMP 07/04/2016 (Exact Date)   BMI 24.87 kg/m  Height: 5' 7.25" (170.8 cm) Ht Readings from Last 3 Encounters:  07/13/16 5' 7.25" (1.708 m)  07/10/15 5' 7.25" (1.708 m)  12/19/14 5' 7.5" (1.715 m)    General appearance: alert, cooperative and appears stated age Head: Normocephalic, without obvious abnormality, atraumatic Neck: no adenopathy, supple, symmetrical, trachea midline and thyroid normal to inspection and palpation Lungs: clear to auscultation bilaterally Breasts: normal appearance, no masses or tenderness, No nipple retraction or dimpling, No nipple discharge or bleeding, No axillary or supraclavicular adenopathy Heart: regular rate and rhythm Abdomen: soft, non-tender; no masses,  no organomegaly Extremities: extremities normal, atraumatic, no cyanosis or edema Skin: Skin color, texture, turgor normal. No rashes or lesions Lymph nodes: Cervical, supraclavicular, and axillary nodes normal. No abnormal inguinal nodes palpated Neurologic: Grossly normal   Pelvic: External genitalia:  no lesions  Urethra:  normal appearing urethra with no masses, tenderness or lesions              Bartholin's and Skene's: normal                 Vagina: normal appearing vagina with normal color and discharge, no lesions, acknowledge discomfort at introital area in vagina, wet prep taken, ph 3.5              Cervix: no cervical motion tenderness, no lesions and nulliparous appearance              Pap taken: No. Bimanual Exam:  Uterus:  normal size, contour, position, consistency, mobility, non-tender and anteverted              Adnexa: normal adnexa and no mass, fullness, tenderness               Rectovaginal: Confirms               Anus:  normal  sphincter tone, no lesions  Chaperone present: yes  Wet prep: Koh, Saline negative for pathogens  A:  Well Woman with normal exam  Contraception Skyla IUD due for removal 12/07/17  Negative wet prep for vaginal infection, suspect position and moisture issue  Family history of Cervical cancer mother age 23    P:   Reviewed health and wellness pertinent to exam  Reviewed warning signs with IUD and need to advise if occurs.  Discussed negative wet prep finding and feel discomfort maybe related to not enough lubrication or moisture on entrance. Discussed coconut oil use with instructions given. Also discussed OTC lubricant use. Discussed trying position change at onset of sexual activity to see if this resolves. Patient will advise if continues to be a problem. Questions addressed.  Pap smear: no  counseled on breast self exam, feminine hygiene, adequate intake of calcium and vitamin D, diet and exercise  return annually or prn  An After Visit Summary was printed and given to the patient.

## 2016-08-19 ENCOUNTER — Ambulatory Visit (INDEPENDENT_AMBULATORY_CARE_PROVIDER_SITE_OTHER): Payer: BLUE CROSS/BLUE SHIELD | Admitting: Urgent Care

## 2016-08-19 ENCOUNTER — Ambulatory Visit (INDEPENDENT_AMBULATORY_CARE_PROVIDER_SITE_OTHER): Payer: BLUE CROSS/BLUE SHIELD

## 2016-08-19 ENCOUNTER — Encounter: Payer: Self-pay | Admitting: Urgent Care

## 2016-08-19 VITALS — BP 119/82 | HR 85 | Temp 97.9°F | Resp 18 | Ht 68.5 in | Wt 161.0 lb

## 2016-08-19 DIAGNOSIS — R059 Cough, unspecified: Secondary | ICD-10-CM

## 2016-08-19 DIAGNOSIS — M545 Low back pain, unspecified: Secondary | ICD-10-CM

## 2016-08-19 DIAGNOSIS — R05 Cough: Secondary | ICD-10-CM | POA: Diagnosis not present

## 2016-08-19 LAB — POCT URINALYSIS DIP (MANUAL ENTRY)
BILIRUBIN UA: NEGATIVE mg/dL
Bilirubin, UA: NEGATIVE
Glucose, UA: NEGATIVE mg/dL
Leukocytes, UA: NEGATIVE
Nitrite, UA: NEGATIVE
PROTEIN UA: NEGATIVE mg/dL
SPEC GRAV UA: 1.01 (ref 1.010–1.025)
Urobilinogen, UA: 0.2 E.U./dL
pH, UA: 7.5 (ref 5.0–8.0)

## 2016-08-19 MED ORDER — CELECOXIB 100 MG PO CAPS: 100.0000 mg | ORAL_CAPSULE | Freq: Two times a day (BID) | ORAL | 1 refills | Status: DC

## 2016-08-19 MED ORDER — CYCLOBENZAPRINE HCL 5 MG PO TABS: 5.0000 mg | ORAL_TABLET | Freq: Three times a day (TID) | ORAL | 1 refills | Status: DC | PRN

## 2016-08-19 NOTE — Patient Instructions (Addendum)
Back Pain, Adult Back pain is very common in adults.The cause of back pain is rarely dangerous and the pain often gets better over time.The cause of your back pain may not be known. Some common causes of back pain include:  Strain of the muscles or ligaments supporting the spine.  Wear and tear (degeneration) of the spinal disks.  Arthritis.  Direct injury to the back. For many people, back pain may return. Since back pain is rarely dangerous, most people can learn to manage this condition on their own. Follow these instructions at home: Watch your back pain for any changes. The following actions may help to lessen any discomfort you are feeling:  Remain active. It is stressful on your back to sit or stand in one place for long periods of time. Do not sit, drive, or stand in one place for more than 30 minutes at a time. Take short walks on even surfaces as soon as you are able.Try to increase the length of time you walk each day.  Exercise regularly as directed by your health care provider. Exercise helps your back heal faster. It also helps avoid future injury by keeping your muscles strong and flexible.  Do not stay in bed.Resting more than 1-2 days can delay your recovery.  Pay attention to your body when you bend and lift. The most comfortable positions are those that put less stress on your recovering back. Always use proper lifting techniques, including:  Bending your knees.  Keeping the load close to your body.  Avoiding twisting.  Find a comfortable position to sleep. Use a firm mattress and lie on your side with your knees slightly bent. If you lie on your back, put a pillow under your knees.  Avoid feeling anxious or stressed.Stress increases muscle tension and can worsen back pain.It is important to recognize when you are anxious or stressed and learn ways to manage it, such as with exercise.  Take medicines only as directed by your health care provider.  Over-the-counter medicines to reduce pain and inflammation are often the most helpful.Your health care provider may prescribe muscle relaxant drugs.These medicines help dull your pain so you can more quickly return to your normal activities and healthy exercise.  Apply ice to the injured area:  Put ice in a plastic bag.  Place a towel between your skin and the bag.  Leave the ice on for 20 minutes, 2-3 times a day for the first 2-3 days. After that, ice and heat may be alternated to reduce pain and spasms.  Maintain a healthy weight. Excess weight puts extra stress on your back and makes it difficult to maintain good posture. Contact a health care provider if:  You have pain that is not relieved with rest or medicine.  You have increasing pain going down into the legs or buttocks.  You have pain that does not improve in one week.  You have night pain.  You lose weight.  You have a fever or chills. Get help right away if:  You develop new bowel or bladder control problems.  You have unusual weakness or numbness in your arms or legs.  You develop nausea or vomiting.  You develop abdominal pain.  You feel faint. This information is not intended to replace advice given to you by your health care provider. Make sure you discuss any questions you have with your health care provider. Document Released: 01/03/2005 Document Revised: 05/14/2015 Document Reviewed: 05/07/2013 Elsevier Interactive Patient Education  2017 Elsevier   Inc.     IF you received an x-ray today, you will receive an invoice from [facility]. Please contact [facility] at [phone] with questions or concerns regarding your invoice.   IF you received labwork today, you will receive an invoice from [facility]. Please contact [facility] at [phone] with questions or concerns regarding your invoice.   Our billing staff will not be able to assist you with questions regarding bills from these  companies.  You will be contacted with the lab results as soon as they are available. The fastest way to get your results is to activate your My Chart account. Instructions are located on the last page of this paperwork. If you have not heard from us regarding the results in 2 weeks, please contact this office.      

## 2016-08-19 NOTE — Progress Notes (Signed)
MRN: [id number] DOB: 1985/05/08  Subjective:   Kristi Fuller is a 31 y.o. female presenting for chief complaint of Back Pain  Reports 2 week history of mid back pain. Pain is constant, sharp in nature, worse with deep breathing and twisting, occasionally radiates up her back and can also feel it in her midabdomen. Has seen a chiropractor with some improvement of her left side. Has also had a mild dry cough and was told that she may have walking pneumonia which has her really worried. She is going to [place] to [place] in [place] of [place]. Has tried Advil (800mg ) with good relief except last night. Denies fever, sore throat, chest pain, shob, n/v, abdominal pain, dysuria, hematuria, trauma, falls, rashes. Denies smoking cigarettes.  Kristi Fuller has a current medication list which includes the following prescription(s): ascorbic acid, biotin, cetirizine, levonorgestrel, and turmeric. Also is allergic to amoxicillin.  Kristi Fuller  has a past medical history of Abnormal Pap smear (12/23/08) and STD (sexually transmitted disease). Also  has a past surgical history that includes Intrauterine device (iud) insertion and Colposcopy (02/10/09).  Objective:   Vitals: BP 119/82   Pulse 85   Temp 97.9 F (36.6 C) (Oral)   Resp 18   Ht 5' 8.5" (1.74 m)   Wt 161 lb (73 kg)   SpO2 98%   BMI 24.12 kg/m   Physical Exam  Constitutional: She is oriented to person, place, and time. She appears well-developed and well-nourished.  Cardiovascular: Normal rate.   Pulmonary/Chest: Effort normal.  Musculoskeletal:       Thoracic back: She exhibits decreased range of motion (rotation). She exhibits no tenderness, no bony tenderness, no swelling, no edema, no deformity and no spasm.       Lumbar back: She exhibits decreased range of motion (flexion, rotation). She exhibits no tenderness, no bony tenderness, no swelling, no edema, no deformity and no spasm.  Neurological: She is alert and oriented to person, place, and time.  She displays normal reflexes.  Skin: Skin is warm and dry.  Psychiatric: She has a normal mood and affect.   Results for orders placed or performed in visit on 08/19/16 (from the past 24 hour(s))  POCT urinalysis dipstick     Status: Abnormal   Collection Time: 08/19/16 10:04 AM  Result Value Ref Range   Color, UA yellow yellow   Clarity, UA clear clear   Glucose, UA negative negative mg/dL   Bilirubin, UA negative negative   Ketones, POC UA negative negative mg/dL   Spec Grav, UA [id number] - 1.025   Blood, UA trace-intact (A) negative   pH, UA 7.5 5.0 - 8.0   Protein Ur, POC negative negative mg/dL   Urobilinogen, UA 0.2 0.2 or 1.0 E.U./dL   Nitrite, UA Negative Negative   Leukocytes, UA Negative Negative   Dg Thoracic Spine 2 View  Result Date: 08/19/2016 CLINICAL DATA:  Acute midline low back pain EXAM: THORACIC SPINE 2 VIEWS COMPARISON:  08/19/2016 FINDINGS: There is no evidence of thoracic spine fracture. Alignment is normal. No other significant bone abnormalities are identified. IMPRESSION: Negative. Electronically Signed   By: Charlett NoseKevin  Dover M.D.   On: 08/19/2016 10:27   Dg Lumbar Spine Complete  Result Date: 08/19/2016 CLINICAL DATA:  Acute bilateral low back pain EXAM: LUMBAR SPINE - COMPLETE 4+ VIEW COMPARISON:  None. FINDINGS: Mild disc space narrowing at L5-S1 compatible with early degenerative disc disease. Normal alignment. No fracture. SI joints are symmetric and unremarkable. IUD noted in  the pelvis. IMPRESSION: Early degenerative disc disease at L5-S1.  No acute findings. Electronically Signed   By: Charlett NoseKevin  Dover M.D.   On: 08/19/2016 10:28   Assessment and Plan :   1. Acute midline low back pain, with sciatica presence unspecified 2. Acute bilateral low back pain without sciatica - Physical exam findings and x-ray reassuring. She does admit to intermittent spotting even with IUD which can explain the trace hematuria. Will start conservative management with Celebrex  and muscle relaxant. Back care reviewed. Patient will f/u in 3 days if no improvement.  3. Cough - Offered patient a chest x-ray, but she declined. Physical exam findings reassuring. Return-to-clinic precautions discussed, patient verbalized understanding.   Wallis BambergMario Curran Lenderman, PA-C [facility] [phone] 08/19/2016  9:38 AM

## 2016-10-10 ENCOUNTER — Ambulatory Visit (INDEPENDENT_AMBULATORY_CARE_PROVIDER_SITE_OTHER): Payer: BLUE CROSS/BLUE SHIELD | Admitting: Family Medicine

## 2016-10-10 ENCOUNTER — Encounter: Payer: Self-pay | Admitting: Family Medicine

## 2016-10-10 VITALS — BP 114/72 | HR 76 | Temp 97.9°F | Resp 18 | Ht 68.0 in | Wt 161.4 lb

## 2016-10-10 DIAGNOSIS — H8112 Benign paroxysmal vertigo, left ear: Secondary | ICD-10-CM | POA: Diagnosis not present

## 2016-10-10 NOTE — Patient Instructions (Addendum)
   IF you received an x-ray today, you will receive an invoice from [facility]. Please contact [facility] at [phone] with questions or concerns regarding your invoice.   IF you received labwork today, you will receive an invoice from [facility]. Please contact [facility] at [phone] with questions or concerns regarding your invoice.   Our billing staff will not be able to assist you with questions regarding bills from these companies.  You will be contacted with the lab results as soon as they are available. The fastest way to get your results is to activate your My Chart account. Instructions are located on the last page of this paperwork. If you have not heard from us regarding the results in 2 weeks, please contact this office.     How to Perform the Epley Maneuver The Epley maneuver is an exercise that relieves symptoms of vertigo. Vertigo is the feeling that you or your surroundings are moving when they are not. When you feel vertigo, you may feel like the room is spinning and have trouble walking. Dizziness is a little different than vertigo. When you are dizzy, you may feel unsteady or light-headed. You can do this maneuver at home whenever you have symptoms of vertigo. You can do it up to 3 times a day until your symptoms go away. Even though the Epley maneuver may relieve your vertigo for a few weeks, it is possible that your symptoms will return. This maneuver relieves vertigo, but it does not relieve dizziness. What are the risks? If it is done correctly, the Epley maneuver is considered safe. Sometimes it can lead to dizziness or nausea that goes away after a short time. If you develop other symptoms, such as changes in vision, weakness, or numbness, stop doing the maneuver and call your health care provider. How to perform the Epley maneuver 1. Sit on the edge of a bed or table with your back straight and your legs extended or hanging over the edge of the  bed or table. 2. Turn your head halfway toward the affected ear or side. 3. Lie backward quickly with your head turned until you are lying flat on your back. You may want to position a pillow under your shoulders. 4. Hold this position for 30 seconds. You may experience an attack of vertigo. This is normal. 5. Turn your head to the opposite direction until your unaffected ear is facing the floor. 6. Hold this position for 30 seconds. You may experience an attack of vertigo. This is normal. Hold this position until the vertigo stops. 7. Turn your whole body to the same side as your head. Hold for another 30 seconds. 8. Sit back up. You can repeat this exercise up to 3 times a day. Follow these instructions at home:  After doing the Epley maneuver, you can return to your normal activities.  Ask your health care provider if there is anything you should do at home to prevent vertigo. He or she may recommend that you: ? Keep your head raised (elevated) with two or more pillows while you sleep. ? Do not sleep on the side of your affected ear. ? Get up slowly from bed. ? Avoid sudden movements during the day. ? Avoid extreme head movement, like looking up or bending over. Contact a health care provider if:  Your vertigo gets worse.  You have other symptoms, including: ? Nausea. ? Vomiting. ? Headache. Get help right away if:  You have vision changes.  You   have a severe or worsening headache or neck pain.  You cannot stop vomiting.  You have new numbness or weakness in any part of your body. Summary  Vertigo is the feeling that you or your surroundings are moving when they are not.  The Epley maneuver is an exercise that relieves symptoms of vertigo.  If the Epley maneuver is done correctly, it is considered safe. You can do it up to 3 times a day. This information is not intended to replace advice given to you by your health care provider. Make sure you discuss any questions you  have with your health care provider. Document Released: 01/08/2013 Document Revised: 11/24/2015 Document Reviewed: 11/24/2015 Elsevier Interactive Patient Education  2017 Elsevier Inc.  

## 2016-10-10 NOTE — Progress Notes (Signed)
9/24/20181:58 PM  Kristi Fuller 1985-11-02, 31 y.o. female [id number]  Chief Complaint  Patient presents with  . Dizziness    x5days pt states it comes and goes, pt states its worse when she moves   . Nausea    HPI:   Patient is a 31 y.o. female who presents today for 5 days of intermittent dizziness. She describes dizziness as "on a boat" but if she moves quickly then it turns into "room spinning" sensation. She reports associated nausea. She denies any specific triggers. She states that sensation self resolves. She denies any recent URI, tinnitus. She does have an occasional associated head pressure feeling with it but no frank headaches. She denies any h/o migraines. She thought she was getting better as over then weekend it was happening less frequently and not as intense but then this morning she woke up very dizzy. She denies any changes in vision, sensation, strength or speech. She denies any significant family history.  She has an IUD in place.   Depression screen [facility] 2/9 10/10/2016 08/19/2016 12/07/2014  Decreased Interest 0 0 0  Down, Depressed, Hopeless 0 0 0  PHQ - 2 Score 0 0 0    Allergies  Allergen Reactions  . Amoxicillin Hives    Current Outpatient Prescriptions on File Prior to Visit  Medication Sig Dispense Refill  . Ascorbic Acid (VITAMIN C PO) Take by mouth daily.    Marland Kitchen BIOTIN PO Take by mouth daily.    . cetirizine (ZYRTEC) 10 MG tablet Take 10 mg by mouth daily.    . Levonorgestrel (SKYLA) 13.5 MG IUD by Intrauterine route.    . tretinoin (RETIN-A) 0.025 % cream Apply 1 application topically as needed.    . TURMERIC PO Take by mouth daily.     No current facility-administered medications on file prior to visit.     Past Medical History:  Diagnosis Date  . 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 STD (sexually transmitted disease)    condyloma    Past Surgical History:  Procedure Laterality Date  . COLPOSCOPY  02/10/09   CIN1  . INTRAUTERINE DEVICE  (IUD) INSERTION     skyla inserted 01/02/12, removed & re-inserted 12-08-14    Social History  Substance Use Topics  . Smoking status: Former Games developer  . Smokeless tobacco: Former Neurosurgeon    Quit date: 12/06/2005  . Alcohol use 6.0 oz/week    10 Standard drinks or equivalent per week    Family History  Problem Relation Age of Onset  . Cancer Mother        cervical  . Hypertension Father   . Stroke Paternal Grandmother     Review of Systems  Constitutional: Negative for chills, diaphoresis and fever.  HENT: Negative for congestion, ear pain, sinus pain and tinnitus.   Eyes: Negative for blurred vision, double vision and photophobia.  Cardiovascular: Negative for chest pain and palpitations.  Gastrointestinal: Positive for nausea. Negative for vomiting.  Neurological: Positive for dizziness. Negative for tingling, sensory change, speech change, focal weakness and headaches.     OBJECTIVE:  Blood pressure 114/72, pulse 76, temperature 97.9 F (36.6 C), temperature source Oral, resp. rate 18, height  (1.727 m), weight 161 lb 6.4 oz (73.2 kg), SpO2 98 %.  Physical Exam  Constitutional: She is oriented to person, place, and time and well-developed, well-nourished, and in no distress.  HENT:  Head: Normocephalic and atraumatic.  Right Ear: Hearing, tympanic membrane, external ear and  ear canal normal.  Left Ear: Hearing, tympanic membrane, external ear and ear canal normal.  Mouth/Throat: Oropharynx is clear and moist.  Eyes: Pupils are equal, round, and reactive to light. EOM are normal.  Neck: Neck supple.  Cardiovascular: Normal rate, regular rhythm and normal heart sounds.  Exam reveals no gallop and no friction rub.   No murmur heard. Pulmonary/Chest: Effort normal and breath sounds normal. She has no wheezes. She has no rales.  Lymphadenopathy:    She has no cervical adenopathy.  Neurological: She is alert and oriented to person, place, and time. She has normal  strength, normal reflexes and intact cranial nerves. She has a normal Cerebellar Exam and a normal Romberg Test. She shows no pronator drift. Gait normal. Coordination normal.  + hill pike maneuver to the LEFT  Skin: Skin is warm and dry.    No results found for this or any previous visit (from the past 24 hour(s)).  No results found.   ASSESSMENT and PLAN  1. Benign paroxysmal positional vertigo of left ear Discussed natural course. Discussed OTC dramamine use. Patient educational handout for epley maneuver given.   Return if symptoms worsen or fail to improve.    Myles Lipps, MD Primary [facility] at [facility] [address] [place], [place] [address] Ph.  [phone] Fax [phone]

## 2016-12-07 ENCOUNTER — Other Ambulatory Visit: Payer: Self-pay | Admitting: Chiropractic Medicine

## 2016-12-07 DIAGNOSIS — M545 Low back pain, unspecified: Secondary | ICD-10-CM

## 2016-12-07 DIAGNOSIS — G8929 Other chronic pain: Secondary | ICD-10-CM

## 2016-12-19 ENCOUNTER — Ambulatory Visit
Admission: RE | Admit: 2016-12-19 | Discharge: 2016-12-19 | Disposition: A | Discharge status: Home / Self Care | Payer: BLUE CROSS/BLUE SHIELD | Source: Ambulatory Visit | Attending: Chiropractic Medicine | Admitting: Chiropractic Medicine

## 2016-12-19 DIAGNOSIS — M545 Low back pain, unspecified: Secondary | ICD-10-CM

## 2016-12-19 DIAGNOSIS — G8929 Other chronic pain: Secondary | ICD-10-CM

## 2017-05-10 ENCOUNTER — Other Ambulatory Visit: Payer: Self-pay | Admitting: Urgent Care

## 2017-05-10 NOTE — Telephone Encounter (Signed)
Called pt to try and schedule on OV per notes for a F/U for back pain. Pt stated that she did not want to make an appt at this time. I advised her to call into the office of she changes her mind.

## 2017-07-14 ENCOUNTER — Encounter: Payer: Self-pay | Admitting: Certified Nurse Midwife

## 2017-07-14 ENCOUNTER — Other Ambulatory Visit (HOSPITAL_COMMUNITY)
Admission: RE | Admit: 2017-07-14 | Discharge: 2017-07-14 | Disposition: A | Discharge status: Home / Self Care | Payer: BLUE CROSS/BLUE SHIELD | Source: Ambulatory Visit | Attending: Obstetrics & Gynecology | Admitting: Obstetrics & Gynecology

## 2017-07-14 ENCOUNTER — Other Ambulatory Visit: Payer: Self-pay

## 2017-07-14 ENCOUNTER — Ambulatory Visit: Payer: BLUE CROSS/BLUE SHIELD | Admitting: Certified Nurse Midwife

## 2017-07-14 VITALS — BP 104/62 | HR 64 | Resp 16 | Ht 67.5 in | Wt 155.0 lb

## 2017-07-14 DIAGNOSIS — Z124 Encounter for screening for malignant neoplasm of cervix: Secondary | ICD-10-CM | POA: Diagnosis not present

## 2017-07-14 DIAGNOSIS — N644 Mastodynia: Secondary | ICD-10-CM

## 2017-07-14 DIAGNOSIS — Z01419 Encounter for gynecological examination (general) (routine) without abnormal findings: Secondary | ICD-10-CM | POA: Diagnosis not present

## 2017-07-14 NOTE — Progress Notes (Signed)
32 y.o. [id number] Married  Caucasian Fe here for annual exam. Periods scant to none. Has noted some tingling in right breast at times for no reason, usually with no activity. No bra change or nipple discharge. 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 Right handed, wondered if more muscle issues. No other health issues today. Skyla IUD due for removal in 11/19 and patient definitely wants to have another IUD. Planning vacation at some point soon!  No LMP recorded. (Menstrual status: IUD).          Sexually active: Yes.    The current method of family planning is IUD.    Exercising: Yes.    some exercise Smoker:  no  Health Maintenance: Pap:  07-10-15 neg HPV HR neg History of Abnormal Pap: yes MMG:  none Self Breast exams: yes Colonoscopy:  none BMD:   none TDaP:  2012 Shingles: no Pneumonia: no Hep C and HIV: not done Labs: no  Review of Systems  Constitutional: Negative.   HENT: Negative.   Eyes: Negative.   Respiratory: Negative.   Gastrointestinal: Negative.   Genitourinary: Negative.   Musculoskeletal: Negative.   Skin:       Tingling in rt breast  Neurological: Negative.   Endo/Heme/Allergies: Negative.   Psychiatric/Behavioral: Negative.       reports that she has quit smoking. She quit smokeless tobacco use about 11 years ago. She reports that she drinks about 6.0 oz of alcohol per week. She reports that she does not use drugs.  Past Medical History:  Diagnosis Date  . 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 STD (sexually transmitted disease)    condyloma    Past Surgical History:  Procedure Laterality Date  . COLPOSCOPY  02/10/09   CIN1  . INTRAUTERINE DEVICE (IUD) INSERTION     skyla inserted 01/02/12, removed & re-inserted 12-08-14    Current Outpatient Medications  Medication Sig Dispense Refill  . Ascorbic Acid (VITAMIN C PO) Take by mouth daily.    Marland Kitchen BIOTIN PO Take by mouth daily.    . cetirizine (ZYRTEC) 10 MG tablet  Take 10 mg by mouth daily.    . Levonorgestrel (SKYLA) 13.5 MG IUD by Intrauterine route.    . tretinoin (RETIN-A) 0.025 % cream Apply 1 application topically as needed.    . TURMERIC PO Take by mouth daily.     No current facility-administered medications for this visit.     Family History  Problem Relation Age of Onset  . Cancer Mother        cervical  . Hypertension Father   . Stroke Paternal Grandmother     ROS:  Pertinent items are noted in HPI.  Otherwise, a comprehensive ROS was negative.  Exam:   BP 104/62   Pulse 64   Resp 16   Ht 5' 7.5" (1.715 m)   Wt 155 lb (70.3 kg)   BMI 23.92 kg/m  Height: 5' 7.5" (171.5 cm) Ht Readings from Last 3 Encounters:  07/14/17 5' 7.5" (1.715 m)  10/10/16 5\' 8"  (1.727 m)  08/19/16 5' 8.5" (1.74 m)    General appearance: alert, cooperative and appears stated age Head: Normocephalic, without obvious abnormality, atraumatic Neck: no adenopathy, supple, symmetrical, trachea midline and thyroid normal to inspection and palpation Lungs: clear to auscultation bilaterally Breasts: normal appearance, no masses or tenderness, No nipple retraction or dimpling, No nipple discharge or bleeding, No axillary or supraclavicular adenopathy Heart: regular  rate and rhythm Abdomen: soft, non-tender; no masses,  no organomegaly Extremities: extremities normal, atraumatic, no cyanosis or edema Skin: Skin color, texture, turgor normal. No rashes or lesions Lymph nodes: Cervical, supraclavicular, and axillary nodes normal. No abnormal inguinal nodes palpated Neurologic: Grossly normal   Pelvic: External genitalia:  no lesions, normal femlae              Urethra:  normal appearing urethra with no masses, tenderness or lesions              Bartholin's and Skene's: normal                 Vagina: normal appearing vagina with normal color and discharge, no lesions              Cervix: no cervical motion tenderness, no lesions, nulliparous appearance and  IUD string  noted in cervix              Pap taken: Yes.   Bimanual Exam:  Uterus:  normal size, contour, position, consistency, mobility, non-tender              Adnexa: normal adnexa and no mass, fullness, tenderness               Rectovaginal: Confirms               Anus:  normal sphincter tone, no lesions  Chaperone present: yes  A:  Well Woman with normal exam  Contraception Skyla IUD due for removal 12/07/17   Right breast tingling versus chest wall muscle response, normal breast exam, no tenderness noted  P:   Reviewed health and wellness pertinent to exam  Discussed will need to call in 10/19 to schedule exchange. Discussed using Rutha BouchardKyleena which is 5 years and will work well also. Given information to check with insurance regarding benefits. Patient will advise.  Discussed starting on Vit E 600 IU daily and see if this resolves. Also change position of arm frequently and see if this may be the issues. Will call if occurs again or does not resolve. Printed handout given.  Pap smear: yes   counseled on breast self exam, adequate intake of calcium and vitamin D, diet and exercise  return annually or prn  An After Visit Summary was printed and given to the patient.

## 2017-07-14 NOTE — Patient Instructions (Addendum)
EXERCISE AND DIET:  We recommended that you start or continue a regular exercise program for good health. Regular exercise means any activity that makes your heart beat faster and makes you sweat.  We recommend exercising at least 30 minutes per day at least 3 days a week, preferably 4 or 5.  We also recommend a diet low in fat and sugar.  Inactivity, poor dietary choices and obesity can cause diabetes, heart attack, stroke, and kidney damage, among others.    ALCOHOL AND SMOKING:  Women should limit their alcohol intake to no more than 7 drinks/beers/glasses of wine (combined, not each!) per week. Moderation of alcohol intake to this level decreases your risk of breast cancer and liver damage. And of course, no recreational drugs are part of a healthy lifestyle.  And absolutely no smoking or even second hand smoke. Most people know smoking can cause heart and lung diseases, but did you know it also contributes to weakening of your bones? Aging of your skin?  Yellowing of your teeth and nails?  CALCIUM AND VITAMIN D:  Adequate intake of calcium and Vitamin D are recommended.  The recommendations for exact amounts of these supplements seem to change often, but generally speaking 600 mg of calcium (either carbonate or citrate) and 800 units of Vitamin D per day seems prudent. Certain women may benefit from higher intake of Vitamin D.  If you are among these women, your doctor will have told you during your visit.    PAP SMEARS:  Pap smears, to check for cervical cancer or precancers,  have traditionally been done yearly, although recent scientific advances have shown that most women can have pap smears less often.  However, every woman still should have a physical exam from her gynecologist every year. It will include a breast check, inspection of the vulva and vagina to check for abnormal growths or skin changes, a visual exam of the cervix, and then an exam to evaluate the size and shape of the uterus and  ovaries.  And after 33 years of age, a rectal exam is indicated to check for rectal cancers. We will also provide age appropriate advice regarding health maintenance, like when you should have certain vaccines, screening for sexually transmitted diseases, bone density testing, colonoscopy, mammograms, etc.   MAMMOGRAMS:  All women over 69 years old should have a yearly mammogram. Many facilities now offer a "3D" mammogram, which may cost around $50 extra out of pocket. If possible,  we recommend you accept the option to have the 3D mammogram performed.  It both reduces the number of women who will be called back for extra views which then turn out to be normal, and it is better than the routine mammogram at detecting truly abnormal areas.    COLONOSCOPY:  Colonoscopy to screen for colon cancer is recommended for all women at age 25.  We know, you hate the idea of the prep.   Costochondritis Costochondritis is swelling and irritation (inflammation) of the tissue (cartilage) that connects your ribs to your breastbone (sternum). This causes pain in the front of your chest. Usually, the pain:  Starts gradually.  Is in more than one rib.  This condition usually goes away on its own over time. Follow these instructions at home:  Do not do anything that makes your pain worse.  If directed, put ice on the painful area: ? Put ice in a plastic bag. ? Place a towel between your skin and the bag. ? Leave  the ice on for 20 minutes, 2-3 times a day.  If directed, put heat on the affected area as often as told by your doctor. Use the heat source that your doctor tells you to use, such as a moist heat pack or a heating pad. ? Place a towel between your skin and the heat source. ? Leave the heat on for 20-30 minutes. ? Take off the heat if your skin turns bright red. This is very important if you cannot feel pain, heat, or cold. You may have a greater risk of getting burned.  Take over-the-counter and  prescription medicines only as told by your doctor.  Return to your normal activities as told by your doctor. Ask your doctor what activities are safe for you.  Keep all follow-up visits as told by your doctor. This is important. Contact a doctor if:  You have chills or a fever.  Your pain does not go away or it gets worse.  You have a cough that does not go away. Get help right away if:  You are short of breath. This information is not intended to replace advice given to you by your health care provider. Make sure you discuss any questions you have with your health care provider. Document Released: 06/22/2007 Document Revised: 07/24/2015 Document Reviewed: 04/29/2015 Elsevier Interactive Patient Education  2018 [employer]  Breast Tenderness Breast tenderness is a common problem for women of all ages. Breast tenderness may cause mild discomfort to severe pain. The pain usually comes and goes in association with your menstrual cycle, but it can be constant. Breast tenderness has many possible causes, including hormone changes and some medicines. Your health care provider may order tests, such as a mammogram or an ultrasound, to check for any unusual findings. Having breast tenderness usually does not mean that you have breast cancer. Follow these instructions at home: Sometimes, reassurance that you do not have breast cancer is all that is needed. In general, follow these home care instructions: Managing pain and discomfort  If directed, apply ice to the area: ? Put ice in a plastic bag. ? Place a towel between your skin and the bag. ? Leave the ice on for 20 minutes, 2-3 times a day.  Make sure you are wearing a supportive bra, especially during exercise. You may also want to wear a supportive bra while sleeping if your breasts are very tender. Medicines  Take over-the-counter and prescription medicines only as told by your health care provider. If the cause of your pain is  infection, you may be prescribed an antibiotic medicine.  If you were prescribed an antibiotic, take it as told by your health care provider. Do not stop taking the antibiotic even if you start to feel better. General instructions  Your health care provider may recommend that you reduce the amount of fat in your diet. You can do this by: ? Limiting fried foods. ? Cooking foods using methods, such as baking, boiling, grilling, and broiling.  Decrease the amount of caffeine in your diet. You can do this by drinking more water and choosing caffeine-free options.  Keep a log of the days and times when your breasts are most tender.  Ask your health care provider how to do breast exams at home. This will help you notice if you have an unusual growth or lump. Contact a health care provider if:  Any part of your breast is hard, red, and hot to the touch. This may be a sign  of infection.  You are not breastfeeding and you have fluid, especially blood or pus, coming out of your nipples.  You have a fever.  You have a new or painful lump in your breast that remains after your menstrual period ends.  Your pain does not improve or it gets worse.  Your pain is interfering with your daily activities. This information is not intended to replace advice given to you by your health care provider. Make sure you discuss any questions you have with your health care provider. Document Released: 12/17/2007 Document Revised: 10/02/2015 Document Reviewed: 10/02/2015 Elsevier Interactive Patient Education  2018 [employer]. We agree, BUT, having colon cancer and not knowing it is worse!!  Colon cancer so often starts as a polyp that can be seen and removed at colonscopy, which can quite literally save your life!  And if your first colonoscopy is normal and you have no family history of colon cancer, most women don't have to have it again for 10 years.  Once every ten years, you can do something that may end up  saving your life, right?  We will be happy to help you get it scheduled when you are ready.  Be sure to check your insurance coverage so you understand how much it will cost.  It may be covered as a preventative service at no cost, but you should check your particular policy.

## 2017-07-18 LAB — CYTOLOGY - PAP
Diagnosis: NEGATIVE
HPV: NOT DETECTED

## 2017-10-27 ENCOUNTER — Telehealth: Payer: Self-pay | Admitting: Certified Nurse Midwife

## 2017-10-27 ENCOUNTER — Telehealth: Payer: Self-pay | Admitting: Obstetrics and Gynecology

## 2017-10-27 DIAGNOSIS — Z30430 Encounter for insertion of intrauterine contraceptive device: Secondary | ICD-10-CM

## 2017-10-27 DIAGNOSIS — Z30432 Encounter for removal of intrauterine contraceptive device: Secondary | ICD-10-CM

## 2017-10-27 NOTE — Telephone Encounter (Signed)
Spoke with patient. Has a skyla IUD due for removal 12/07/17. Patient request to schedule removal and insertion. Patient would like to discuss Kyleena vs Skyla prior to exchange. IUD exchange scheduled for 12/04/17 at 1pm with Dr. Edward Jolly. Patient declined earlier OV. Advised to take Motrin 800 mg with food and water one hour before procedure.  Orders placed for precert.   Routing to provider for final review. Patient is agreeable to disposition. Will close encounter.  Cc: Leota Sauers, CNM, Harland Dingwall, [place] Pilon

## 2017-10-27 NOTE — Telephone Encounter (Addendum)
Patient canceled her IUD exchange 12/04/17 and would like reschedule with Dr.Silva

## 2017-10-27 NOTE — Telephone Encounter (Signed)
Spoke with patient, request to reschedule IUD exchange. Rescheduled for 12/01/17 at 2pm with Dr. Edward Jolly.   Routing to provider for final review. Patient is agreeable to disposition. Will close encounter.

## 2017-10-27 NOTE — Telephone Encounter (Signed)
Patient calling to schedule an appointment for iud removal and insertion.

## 2017-10-30 MED ORDER — MISOPROSTOL 200 MCG PO TABS: ORAL_TABLET | ORAL | 0 refills | Status: DC

## 2017-10-30 NOTE — Telephone Encounter (Signed)
Spoke with patient, advised as seen below per Dr. Edward Jolly. Cytotec RX to verified pharmacy. Patient read back instructions. Patient verbalizes understanding and is agreeable.

## 2017-10-30 NOTE — Addendum Note (Signed)
Addended by: Leda Min on: 10/30/2017 11:08 AM   Modules accepted: Orders

## 2017-10-30 NOTE — Telephone Encounter (Signed)
I would recommend Cytotec 200 mcg pv at hs the night before procedure and the am of procedure.

## 2017-11-30 NOTE — Progress Notes (Signed)
GYNECOLOGY  VISIT   HPI: 32 y.o.   Married  Caucasian  female   G2P0020 with Patient's last menstrual period was 11/28/2017 (exact date).   here for IUD exchange.    This is her second Skyla IUD.  Having menstruation for the last couple of months.  Would like to have a [place] IUD placed today.   Used Cytotec and took Ibuprofen 800 mg.  UPT - negative.   GYNECOLOGIC HISTORY: Patient's last menstrual period was 11/28/2017 (exact date). Contraception: Skyla IUD 12-08-14 Menopausal hormone therapy:  none Last mammogram:  n/a Last pap smear: 07-14-17 Neg:Neg HR HPV, 07-10-15 Neg:Neg HR HPV        OB History    Gravida  2   Para  0   Term  0   Preterm      AB  2   Living  0     SAB      TAB  2   Ectopic      Multiple      Live Births                 Patient Active Problem List   Diagnosis Date Noted  . Acute bilateral low back pain without sciatica 08/19/2016  . Costochondritis 07/02/2014    Past Medical History:  Diagnosis Date  . 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 STD (sexually transmitted disease)    condyloma    Past Surgical History:  Procedure Laterality Date  . COLPOSCOPY  02/10/09   CIN1  . INTRAUTERINE DEVICE (IUD) INSERTION     skyla inserted 01/02/12, removed & re-inserted 12-08-14    Current Outpatient Medications  Medication Sig Dispense Refill  . Ascorbic Acid (VITAMIN C PO) Take by mouth daily.    Marland Kitchen BIOTIN PO Take by mouth daily.    . cetirizine (ZYRTEC) 10 MG tablet Take 10 mg by mouth daily.    . Levonorgestrel (SKYLA) 13.5 MG IUD by Intrauterine route.    . misoprostol (CYTOTEC) 200 MCG tablet Place one tablet (200 mcg) vaginally at hs the night before procedure and place one tab vaginally the morning of procedure. 2 tablet 0  . tretinoin (RETIN-A) 0.025 % cream Apply 1 application topically as needed.    . TURMERIC PO Take by mouth daily.     No current facility-administered medications for this visit.      ALLERGIES:  Amoxicillin  Family History  Problem Relation Age of Onset  . Cancer Mother        cervical  . Hypertension Father   . Stroke Paternal Grandmother     Social History   Socioeconomic History  . Marital status: Married    Spouse name: Greta Doom  . Number of children: 0  . Years of education: Not on file  . Highest education level: Not on file  Occupational History  . Occupation: tax Airline pilot  Social Needs  . Financial resource strain: Not on file  . Food insecurity:    Worry: Not on file    Inability: Not on file  . Transportation needs:    Medical: Not on file    Non-medical: Not on file  Tobacco Use  . Smoking status: Former Games developer  . Smokeless tobacco: Former Neurosurgeon    Quit date: 12/06/2005  Substance and Sexual Activity  . Alcohol use: Yes    Alcohol/week: 10.0 standard drinks    Types: 10 Standard drinks or equivalent per week  . Drug use: No  .  Sexual activity: Yes    Partners: Male    Birth control/protection: IUD  Lifestyle  . Physical activity:    Days per week: Not on file    Minutes per session: Not on file  . Stress: Not on file  Relationships  . Social connections:    Talks on phone: Not on file    Gets together: Not on file    Attends religious service: Not on file    Active member of club or organization: Not on file    Attends meetings of clubs or organizations: Not on file    Relationship status: Not on file  . Intimate partner violence:    Fear of current or ex partner: Not on file    Emotionally abused: Not on file    Physically abused: Not on file    Forced sexual activity: Not on file  Other Topics Concern  . Not on file  Social History Narrative   Lives with her husband    Review of Systems  All other systems reviewed and are negative.   PHYSICAL EXAMINATION:    BP 118/60   Pulse 60   Ht 5' 7.5" (1.715 m)   Wt 158 lb 6.4 oz (71.8 kg)   LMP 11/28/2017 (Exact Date)   BMI 24.44 kg/m     General appearance: alert,  cooperative and appears stated age   Pelvic: External genitalia:  no lesions              Urethra:  normal appearing urethra with no masses, tenderness or lesions              Bartholins and Skenes: normal                 Vagina: normal appearing vagina with normal color and discharge, no lesions              Cervix: no lesions                Bimanual Exam:  Uterus:  normal size, contour, position, consistency, mobility, non-tender              Adnexa: no mass, fullness, tenderness             Procedure - Dkyla UD removal and reinsertion of Kyleena IUD.       Kyleena IUD - lot number [id number], expiration 10/2019. Consent for procedure.  Speculum placed in vagina.  Sterile prep of cervix with Hibiclens.  IUD removed, intact, with ring forceps, shown to patient, and discarded.  Paracervical block with 10 cc 1% lidocaine, lot number [id number], exp 1/23. Tenaculum to anterior cervical lip.  Uterus sounded to almost 6.5 cm.  Kyleena placed without difficulty. Strings trimmed. Bimanual exam repeated. No change.  Minimal EBL.  No complications.    Chaperone was present for exam.  ASSESSMENT  Skyla IUD removal.  Kyleena IUD placement.   PLAN  Instructions and precautions given to patient.  Back up protection for one week.  Fu in 4 weeks.  IUD card to patient.    An After Visit Summary was printed and given to the patient.

## 2017-12-01 ENCOUNTER — Encounter: Payer: Self-pay | Admitting: Obstetrics and Gynecology

## 2017-12-01 ENCOUNTER — Other Ambulatory Visit: Payer: Self-pay

## 2017-12-01 ENCOUNTER — Ambulatory Visit (INDEPENDENT_AMBULATORY_CARE_PROVIDER_SITE_OTHER): Payer: BLUE CROSS/BLUE SHIELD | Admitting: Obstetrics and Gynecology

## 2017-12-01 VITALS — BP 118/60 | HR 60 | Ht 67.5 in | Wt 158.4 lb

## 2017-12-01 DIAGNOSIS — Z30432 Encounter for removal of intrauterine contraceptive device: Secondary | ICD-10-CM | POA: Diagnosis not present

## 2017-12-01 DIAGNOSIS — Z30430 Encounter for insertion of intrauterine contraceptive device: Secondary | ICD-10-CM

## 2017-12-01 DIAGNOSIS — Z01812 Encounter for preprocedural laboratory examination: Secondary | ICD-10-CM | POA: Diagnosis not present

## 2017-12-01 LAB — POCT URINE PREGNANCY: Preg Test, Ur: NEGATIVE

## 2017-12-01 NOTE — Patient Instructions (Signed)

## 2017-12-04 ENCOUNTER — Ambulatory Visit: Payer: Self-pay | Admitting: Obstetrics and Gynecology

## 2017-12-28 NOTE — Progress Notes (Signed)
GYNECOLOGY  VISIT   HPI: 32 y.o.   Married  Caucasian  female   G2P0020 with No LMP recorded. (Menstrual status: IUD).   here for 4 week follow [facility].    Previously had Skyla IUD.  Little spotting that is random.  Having some sharp pain when she needs to void and is holding her urine.  Can also occur randomly and can occur once every 2 weeks. Pain lasts a couple of seconds and not associated with bleeding.  Pain is not associated with bleeding.  No pain with intercourse.  States she had a similar pain with her first IUD but not the second one.   Has not checked her IUD.   BMs are at least once a day.  No constipation issues.  GYNECOLOGIC HISTORY:  No LMP recorded. (Menstrual status: IUD). Contraception: Rutha BouchardKyleena IUD -- 12-01-17 Menopausal hormone therapy:  n/a Last mammogram:  n/a Last pap smear:  07-14-17 Neg:Neg HR HPV, 07-10-15 Neg:Neg HR HPV        OB History    Gravida  2   Para  0   Term  0   Preterm      AB  2   Living  0     SAB      TAB  2   Ectopic      Multiple      Live Births                 Patient Active Problem List   Diagnosis Date Noted  . Acute bilateral low back pain without sciatica 08/19/2016  . Costochondritis 07/02/2014    Past Medical History:  Diagnosis Date  . 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 STD (sexually transmitted disease)    condyloma    Past Surgical History:  Procedure Laterality Date  . COLPOSCOPY  02/10/09   CIN1  . INTRAUTERINE DEVICE (IUD) INSERTION     skyla inserted 01/02/12, removed & re-inserted 12-08-14    Current Outpatient Medications  Medication Sig Dispense Refill  . Ascorbic Acid (VITAMIN C PO) Take by mouth daily.    Marland Kitchen. BIOTIN PO Take by mouth daily.    . cetirizine (ZYRTEC) 10 MG tablet Take 10 mg by mouth daily.    . Levonorgestrel (KYLEENA) 19.5 MG IUD by Intrauterine route.    . tretinoin (RETIN-A) 0.025 % cream Apply 1 application topically as needed.     . TURMERIC PO Take by mouth daily.     No current facility-administered medications for this visit.      ALLERGIES: Amoxicillin  Family History  Problem Relation Age of Onset  . Cancer Mother        cervical  . Hypertension Father   . Stroke Paternal Grandmother     Social History   Socioeconomic History  . Marital status: Married    Spouse name: Greta Doomndrew Smith  . Number of children: 0  . Years of education: Not on file  . Highest education level: Not on file  Occupational History  . Occupation: tax Airline pilotaccountant  Social Needs  . Financial resource strain: Not on file  . Food insecurity:    Worry: Not on file    Inability: Not on file  . Transportation needs:    Medical: Not on file    Non-medical: Not on file  Tobacco Use  . Smoking status: Former Games developermoker  . Smokeless tobacco: Former NeurosurgeonUser    Quit date: 12/06/2005  Substance  and Sexual Activity  . Alcohol use: Yes    Alcohol/week: 10.0 standard drinks    Types: 10 Standard drinks or equivalent per week  . Drug use: No  . Sexual activity: Yes    Partners: Male    Birth control/protection: I.U.D.  Lifestyle  . Physical activity:    Days per week: Not on file    Minutes per session: Not on file  . Stress: Not on file  Relationships  . Social connections:    Talks on phone: Not on file    Gets together: Not on file    Attends religious service: Not on file    Active member of club or organization: Not on file    Attends meetings of clubs or organizations: Not on file    Relationship status: Not on file  . Intimate partner violence:    Fear of current or ex partner: Not on file    Emotionally abused: Not on file    Physically abused: Not on file    Forced sexual activity: Not on file  Other Topics Concern  . Not on file  Social History Narrative   Lives with her husband    Review of Systems  All other systems reviewed and are negative.   PHYSICAL EXAMINATION:    BP 110/72 (BP Location: Right Arm,  Patient Position: Sitting, Cuff Size: Normal)   Pulse 66   Ht 5' 7.5" (1.715 m)   Wt 157 lb 9.6 oz (71.5 kg)   BMI 24.32 kg/m     Gen:  NAD.     Pelvic: External genitalia:  no lesions              Urethra:  normal appearing urethra with no masses, tenderness or lesions              Bartholins and Skenes: normal                 Vagina: normal appearing vagina with normal color and discharge, no lesions              Cervix: no lesions.  IUD strings noted.  Light brown mucous.                 Bimanual Exam:  Uterus:  normal size, contour, position, consistency, mobility, non-tender              Adnexa: no mass, fullness, tenderness              Chaperone was present for exam.  ASSESSMENT  Kyleena IUD check up.   PLAN  Reassurance regarding bleeding profile, pregnancy prevention, and position of the IUD.  If pelvic pain is more persistent, will order pelvic ultrasound. FU for annual exam with Sara Chu.   An After Visit Summary was printed and given to the patient.  _15_____ minutes face to face time of which over 50% was spent in counseling.

## 2017-12-29 ENCOUNTER — Ambulatory Visit (INDEPENDENT_AMBULATORY_CARE_PROVIDER_SITE_OTHER): Payer: BLUE CROSS/BLUE SHIELD | Admitting: Obstetrics and Gynecology

## 2017-12-29 ENCOUNTER — Other Ambulatory Visit: Payer: Self-pay

## 2017-12-29 ENCOUNTER — Encounter: Payer: Self-pay | Admitting: Obstetrics and Gynecology

## 2017-12-29 VITALS — BP 110/72 | HR 66 | Ht 67.5 in | Wt 157.6 lb

## 2017-12-29 DIAGNOSIS — Z30431 Encounter for routine checking of intrauterine contraceptive device: Secondary | ICD-10-CM

## 2018-07-19 ENCOUNTER — Other Ambulatory Visit: Payer: Self-pay

## 2018-07-24 ENCOUNTER — Ambulatory Visit (INDEPENDENT_AMBULATORY_CARE_PROVIDER_SITE_OTHER): Payer: BC Managed Care – PPO | Admitting: Certified Nurse Midwife

## 2018-07-24 ENCOUNTER — Encounter: Payer: Self-pay | Admitting: Certified Nurse Midwife

## 2018-07-24 ENCOUNTER — Other Ambulatory Visit: Payer: Self-pay

## 2018-07-24 VITALS — BP 116/72 | HR 68 | Temp 97.5°F | Ht 67.0 in | Wt 142.0 lb

## 2018-07-24 DIAGNOSIS — Z01419 Encounter for gynecological examination (general) (routine) without abnormal findings: Secondary | ICD-10-CM

## 2018-07-24 NOTE — Progress Notes (Signed)
33 y.o. G35P0020 Married  Caucasian Fe here for annual exam. Periods just spotting. Have not been working out in 2 months and feels she has loss muscle mass. Has had increased back pain with standing with working at home. Now sitting and seems less intense.  No LMP recorded. (Menstrual status: IUD).          Sexually active: Yes.    The current method of family planning is IUD.    Exercising: No.  exercise Smoker:  no  Review of Systems  Constitutional: Negative.   HENT: Negative.   Eyes: Negative.   Respiratory: Negative.   Cardiovascular: Negative.   Gastrointestinal: Negative.   Genitourinary: Negative.   Musculoskeletal: Positive for back pain.       Seeing physical therapy  Skin: Negative.   Neurological: Negative.   Endo/Heme/Allergies: Negative.   Psychiatric/Behavioral: Negative.     Health Maintenance: Pap:  07-14-17 neg HPV HR neg History of Abnormal Pap: yes MMG:  none Self Breast exams: occ Colonoscopy:  none BMD:   none TDaP:  2012 Shingles: no Pneumonia: no Hep C and HIV: not done Labs: no   reports that she has quit smoking. She quit smokeless tobacco use about 12 years ago. She reports current alcohol use of about 10.0 standard drinks of alcohol per week. She reports that she does not use drugs.  Past Medical History:  Diagnosis Date  . 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 STD (sexually transmitted disease)    condyloma    Past Surgical History:  Procedure Laterality Date  . COLPOSCOPY  02/10/09   CIN1  . INTRAUTERINE DEVICE (IUD) INSERTION     skyla inserted 01/02/12, removed & re-inserted 12-08-14    Current Outpatient Medications  Medication Sig Dispense Refill  . Ascorbic Acid (VITAMIN C PO) Take by mouth daily.    Marland Kitchen BIOTIN PO Take by mouth daily.    . cetirizine (ZYRTEC) 10 MG tablet Take 10 mg by mouth daily.    . Levonorgestrel (KYLEENA) 19.5 MG IUD by Intrauterine route.    . tretinoin (RETIN-A) 0.025 % cream Apply 1 application  topically as needed.    . TURMERIC PO Take by mouth daily.     No current facility-administered medications for this visit.     Family History  Problem Relation Age of Onset  . Cancer Mother        cervical  . Hypertension Father   . Stroke Paternal Grandmother     ROS:  Pertinent items are noted in HPI.  Otherwise, a comprehensive ROS was negative.  Exam:   BP 116/72   Pulse 68   Temp (!) 97.5 F (36.4 C) (Skin)   Ht 5' 6.75" (1.695 m)   Wt 142 lb (64.4 kg)   BMI 22.41 kg/m  Height: 5' 6.75" (169.5 cm) Ht Readings from Last 3 Encounters:  07/24/18 5' 6.75" (1.695 m)  12/29/17 5' 7.5" (1.715 m)  12/01/17 5' 7.5" (1.715 m)    General appearance: alert, cooperative and appears stated age Head: Normocephalic, without obvious abnormality, atraumatic Neck: no adenopathy, supple, symmetrical, trachea midline and thyroid normal to inspection and palpation Lungs: clear to auscultation bilaterally Breasts: normal appearance, no masses or tenderness, No nipple retraction or dimpling, No nipple discharge or bleeding, No axillary or supraclavicular adenopathy Heart: regular rate and rhythm Abdomen: soft, non-tender; no masses,  no organomegaly Extremities: extremities normal, atraumatic, no cyanosis or edema Skin: Skin color, texture, turgor normal. No rashes or  lesions Lymph nodes: Cervical, supraclavicular, and axillary nodes normal. No abnormal inguinal nodes palpated Neurologic: Grossly normal   Pelvic: External genitalia:  no lesions              Urethra:  normal appearing urethra with no masses, tenderness or lesions              Bartholin's and Skene's: normal                 Vagina: normal appearing vagina with normal color and discharge, no lesions              Cervix: no cervical motion tenderness, no lesions, nulliparous appearance and IUD string noted in cervix              Pap taken: No. Bimanual Exam:  Uterus:  normal size, contour, position, consistency,  mobility, non-tender and anteverted              Adnexa: normal adnexa and no mass, fullness, tenderness               Rectovaginal: Confirms               Anus:  normal sphincter tone, no lesions  Chaperone present: yes  A:  Well Woman with normal exam  Contraception Kyleena IUD due for removal 12/02/2022  Chronic back pain under treatment with PT.  P:   Reviewed health and wellness pertinent to exam  Warning signs reviewed with IUD and bleeding expectations. Questions addressed.  Continue follow up as indicated with PT, may consider Orthopedic evaluation.  Pap smear: no  counseled on mammography screening, feminine hygiene, adequate intake of calcium and vitamin D, diet and exercise  return annually or prn  An After Visit Summary was printed and given to the patient.

## 2019-02-25 ENCOUNTER — Other Ambulatory Visit: Payer: BLUE CROSS/BLUE SHIELD

## 2019-04-05 ENCOUNTER — Encounter: Payer: Self-pay | Admitting: Certified Nurse Midwife

## 2019-06-28 ENCOUNTER — Ambulatory Visit
Admission: RE | Admit: 2019-06-28 | Discharge: 2019-06-28 | Disposition: A | Discharge status: Home / Self Care | Payer: No Typology Code available for payment source | Source: Ambulatory Visit

## 2019-06-28 ENCOUNTER — Other Ambulatory Visit: Payer: Self-pay

## 2019-06-28 VITALS — BP 116/78 | HR 63 | Temp 97.8°F | Resp 16

## 2019-06-28 DIAGNOSIS — M25561 Pain in right knee: Secondary | ICD-10-CM | POA: Diagnosis not present

## 2019-06-28 NOTE — ED Provider Notes (Signed)
[facility]    CSN: [id number] Arrival date & time: 06/28/19  1101      History   Chief Complaint Chief Complaint  Patient presents with  . Knee Pain  . Appointment    HPI Kristi Fuller is a 34 y.o. female.   34 year old female comes in for 1 week history of atraumatic right knee pain and swelling.  Pain is directly superior and inferior of the patella, no pain at rest, pain with range of motion/weightbearing.  Denies radiation of pain.  Has intermittent swelling to the knee without erythema, warmth, fever.  Denies numbness, tingling.  No significant changes/increase in activity.  Ibuprofen 400 to 600 mg daily to twice daily with temporary relief.     Past Medical History:  Diagnosis Date  . 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 STD (sexually transmitted disease)    condyloma    Patient Active Problem List   Diagnosis Date Noted  . Acute bilateral low back pain without sciatica 08/19/2016  . Costochondritis 07/02/2014    Past Surgical History:  Procedure Laterality Date  . COLPOSCOPY  02/10/09   CIN1  . INTRAUTERINE DEVICE (IUD) INSERTION     skyla inserted 01/02/12, removed & re-inserted 12-08-14    OB History    Gravida  2   Para  0   Term  0   Preterm      AB  2   Living  0     SAB      TAB  2   Ectopic      Multiple      Live Births               Home Medications    Prior to Admission medications   Medication Sig Start Date End Date Taking? Authorizing Provider  Ascorbic Acid (VITAMIN C PO) Take by mouth daily.    [provider]  BIOTIN PO Take by mouth daily.    [provider]  cetirizine (ZYRTEC) 10 MG tablet Take 10 mg by mouth daily.    [provider]  Levonorgestrel (KYLEENA) 19.5 MG IUD by Intrauterine route.    [provider]  tretinoin (RETIN-A) 0.025 % cream Apply 1 application topically as needed. 07/28/16   [provider]  TURMERIC PO Take by mouth daily.     [provider]    Family History Family History  Problem Relation Age of Onset  . Cancer Mother        cervical  . Hypertension Father   . Stroke Paternal Grandmother     Social History Social History   Tobacco Use  . Smoking status: Former Research scientist (life sciences)  . Smokeless tobacco: Former Systems developer    Quit date: 12/06/2005  Substance Use Topics  . Alcohol use: Yes    Alcohol/week: 10.0 standard drinks    Types: 10 Standard drinks or equivalent per week  . Drug use: No     Allergies   Amoxicillin   Review of Systems Review of Systems  Reason unable to perform ROS: See HPI as above.     Physical Exam Triage Vital Signs ED Triage Vitals [06/28/19 1108]  Enc Vitals Group     BP 116/78     Pulse Rate 63     Resp 16     Temp 97.8 F (36.6 C)     Temp src      SpO2 97 %     Weight  Height      Head Circumference      Peak Flow      Pain Score 5     Pain Loc      Pain Edu?      Excl. in GC?    No data found.  Updated Vital Signs BP 116/78   Pulse 63   Temp 97.8 F (36.6 C)   Resp 16   LMP  (LMP Unknown)   SpO2 97%   Physical Exam Constitutional:      General: She is not in acute distress.    Appearance: Normal appearance. She is well-developed. She is not toxic-appearing or diaphoretic.  HENT:     Head: Normocephalic and atraumatic.  Eyes:     Conjunctiva/sclera: Conjunctivae normal.     Pupils: Pupils are equal, round, and reactive to light.  Pulmonary:     Effort: Pulmonary effort is normal. No respiratory distress.     Comments: Speaking in full sentences without difficulty Musculoskeletal:     Cervical back: Normal range of motion and neck supple.     Comments: No obvious swelling, erythema, warmth, contusion.  No tenderness to palpation of the knee.  Patient points to patellar tendon, quadriceps tendon area for pain.  Full range of motion of knee, with active extension feeling crepitus to the patellar.  Passive range of motion without  crepitus.  Strength 5/5.  Sensation intact.  No pain with valgus and varus motion.  Skin:    General: Skin is warm and dry.  Neurological:     Mental Status: She is alert and oriented to person, place, and time.      UC Treatments / Results  Labs (all labs ordered are listed, but only abnormal results are displayed) Labs Reviewed - No data to display  EKG   Radiology No results found.  Procedures Procedures (including critical care time)  Medications Ordered in UC Medications - No data to display  Initial Impression / Assessment and Plan / UC Course  I have reviewed the triage vital signs and the nursing notes.  Pertinent labs & imaging results that were available during my care of the patient were reviewed by me and considered in my medical decision making (see chart for details).    ?  Bursitis versus tendinitis.  Will continue NSAIDs, ice compress, knee sleeve during activity.  Return precautions given.  Patient expresses understanding and agrees to plan.  Final Clinical Impressions(s) / UC Diagnoses   Final diagnoses:  Acute pain of right knee    ED Prescriptions    None     PDMP not reviewed this encounter.   Belinda Fisher, PA-C 06/28/19 1144

## 2019-06-28 NOTE — ED Triage Notes (Signed)
Nontraumatic right knee pain and swelling x 1 week. States Advil helping.

## 2019-06-28 NOTE — Discharge Instructions (Signed)
This is likely due to inflammation of the bursa/tendon. Ibuprofen 400-800mg  three times a day for the next 5-10 days. Ice compress, rest, knee brace during activity. Can add voltaren gel as needed. Follow up with sports medicine if symptoms not improving, worsening.

## 2019-07-26 ENCOUNTER — Ambulatory Visit: Payer: BC Managed Care – PPO | Admitting: Certified Nurse Midwife

## 2019-08-16 ENCOUNTER — Other Ambulatory Visit: Payer: Self-pay

## 2019-08-16 ENCOUNTER — Emergency Department (HOSPITAL_BASED_OUTPATIENT_CLINIC_OR_DEPARTMENT_OTHER)
Admission: EM | Admit: 2019-08-16 | Discharge: 2019-08-16 | Disposition: A | Discharge status: Home / Self Care | Payer: No Typology Code available for payment source | Attending: Emergency Medicine | Admitting: Emergency Medicine

## 2019-08-16 ENCOUNTER — Emergency Department (HOSPITAL_BASED_OUTPATIENT_CLINIC_OR_DEPARTMENT_OTHER): Payer: No Typology Code available for payment source

## 2019-08-16 ENCOUNTER — Encounter (HOSPITAL_BASED_OUTPATIENT_CLINIC_OR_DEPARTMENT_OTHER): Payer: Self-pay

## 2019-08-16 ENCOUNTER — Ambulatory Visit
Admission: EM | Admit: 2019-08-16 | Discharge: 2019-08-16 | Discharge status: Absent without leave | Payer: No Typology Code available for payment source

## 2019-08-16 DIAGNOSIS — Y92320 Baseball field as the place of occurrence of the external cause: Secondary | ICD-10-CM | POA: Insufficient documentation

## 2019-08-16 DIAGNOSIS — S0990XA Unspecified injury of head, initial encounter: Secondary | ICD-10-CM | POA: Insufficient documentation

## 2019-08-16 DIAGNOSIS — Y999 Unspecified external cause status: Secondary | ICD-10-CM | POA: Insufficient documentation

## 2019-08-16 DIAGNOSIS — W2103XA Struck by baseball, initial encounter: Secondary | ICD-10-CM | POA: Insufficient documentation

## 2019-08-16 DIAGNOSIS — Y9364 Activity, baseball: Secondary | ICD-10-CM | POA: Diagnosis not present

## 2019-08-16 DIAGNOSIS — Z87891 Personal history of nicotine dependence: Secondary | ICD-10-CM | POA: Diagnosis not present

## 2019-08-16 NOTE — Discharge Instructions (Signed)
Please take Tylenol (acetaminophen) to relieve your pain.  You may take tylenol, up to 1,000 mg (two extra strength pills).  Do not take more than 3,000 mg tylenol in a 24 hour period.  Please check all medication labels as many medications such as pain and cold medications may contain tylenol. Please do not drink alcohol while taking this medication.  ° °

## 2019-08-16 NOTE — Discharge Instructions (Signed)
est

## 2019-08-16 NOTE — ED Triage Notes (Signed)
Pt was struck right parietal area by hit baseball at baseball game last night-no break in skin noted-no LOC-pt c/o ringing/fullness in right ear, "my nose feels like it's about to bleed"/no nosebleed- "right eye feels heavy"-difficulty focusing that has improved-NAD-steady gait

## 2019-08-16 NOTE — ED Notes (Signed)
Pt discharged to home NAD.  

## 2019-08-16 NOTE — ED Provider Notes (Signed)
[facility] EMERGENCY DEPARTMENT Provider Note   CSN: [id number] Arrival date & time: 08/16/19  1535     History Chief Complaint  Patient presents with  . Head Injury    Kristi Fuller is a 34 y.o. female with no pertinent past medical history, and does not take anticoagulation, who presents today for evaluation of a head injury.  She was struck on the right side of her head by a baseball at a baseball game last night.  She denies any loss of consciousness.  She reports occasional ringing in her right ear, she also reports feeling like her balance is slightly off.  She has not fallen or stumbled because of this.  She reports that her headache has improved.  HPI     Past Medical History:  Diagnosis Date  . 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 STD (sexually transmitted disease)    condyloma    Patient Active Problem List   Diagnosis Date Noted  . Acute bilateral low back pain without sciatica 08/19/2016  . Costochondritis 07/02/2014    Past Surgical History:  Procedure Laterality Date  . COLPOSCOPY  02/10/09   CIN1  . INTRAUTERINE DEVICE (IUD) INSERTION     skyla inserted 01/02/12, removed & re-inserted 12-08-14     OB History    Gravida  2   Para  0   Term  0   Preterm      AB  2   Living  0     SAB      TAB  2   Ectopic      Multiple      Live Births              Family History  Problem Relation Age of Onset  . Cancer Mother        cervical  . Hypertension Father   . Stroke Paternal Grandmother     Social History   Tobacco Use  . Smoking status: Former Games developer  . Smokeless tobacco: Former Neurosurgeon    Quit date: 12/06/2005  Vaping Use  . Vaping Use: Never used  Substance Use Topics  . Alcohol use: Yes    Alcohol/week: 2.0 standard drinks    Types: 2 Glasses of wine per week    Comment: daily  . Drug use: No    Home Medications Prior to Admission medications   Medication Sig Start Date End Date Taking? Authorizing  Provider  Ascorbic Acid (VITAMIN C PO) Take by mouth daily.    [provider]  BIOTIN PO Take by mouth daily.    [provider]  cetirizine (ZYRTEC) 10 MG tablet Take 10 mg by mouth daily.    [provider]  Levonorgestrel (KYLEENA) 19.5 MG IUD by Intrauterine route.    [provider]  tretinoin (RETIN-A) 0.025 % cream Apply 1 application topically as needed. 07/28/16   [provider]  TURMERIC PO Take by mouth daily.    [provider]    Allergies    Amoxicillin  Review of Systems   Review of Systems  Constitutional: Negative for chills and fever.  Eyes: Negative for photophobia, redness and visual disturbance.  Musculoskeletal: Negative for neck pain.  Neurological: Positive for dizziness and headaches. Negative for seizures, speech difficulty, weakness and numbness.  Psychiatric/Behavioral: Negative for confusion.  All other systems reviewed and are negative.   Physical Exam Updated Vital Signs BP (!) 132/86 (BP Location: Left Arm)   Pulse  72   Temp 98.5 F (36.9 C) (Oral)   Resp 18   Ht 5\' 7"  (1.702 m)   Wt 71.7 kg   SpO2 100%   BMI 24.75 kg/m   Physical Exam Vitals and nursing note reviewed.  Constitutional:      General: She is not in acute distress.    Appearance: She is well-developed. She is not diaphoretic.  HENT:     Head: Normocephalic.     Comments: Mild contusion right sided parietal scalp.  No raccoon's eyes or battle signs bilaterally.    Right Ear: Tympanic membrane, ear canal and external ear normal.     Left Ear: Tympanic membrane, ear canal and external ear normal.  Eyes:     General: No scleral icterus.       Right eye: No discharge.        Left eye: No discharge.     Conjunctiva/sclera: Conjunctivae normal.  Cardiovascular:     Rate and Rhythm: Normal rate and regular rhythm.  Pulmonary:     Effort: Pulmonary effort is normal. No respiratory distress.     Breath sounds: No stridor.    Abdominal:     General: There is no distension.  Musculoskeletal:        General: No deformity.     Cervical back: Normal range of motion.  Skin:    General: Skin is warm and dry.  Neurological:     Mental Status: She is alert.     Motor: No abnormal muscle tone.     Comments: Patient is awake and alert.  Answers questions appropriately.  Speech is not slurred.  No obvious word finding difficulties.  Facial movements are symmetric.  Gait is normal without evidence of ataxia.  5/5 strength bilateral upper and lower extremities.  Pupils equal round reactive to light without nystagmus.  EOM intact.  Coordination is grossly intact.  Psychiatric:        Mood and Affect: Mood normal.        Behavior: Behavior normal.     ED Results / Procedures / Treatments   Labs (all labs ordered are listed, but only abnormal results are displayed) Labs Reviewed - No data to display  EKG None  Radiology CT Head Wo Contrast  Result Date: 08/16/2019 CLINICAL DATA:  Head trauma, moderate/severe. Additional history provided: Patient reports head injury yesterday hitting right side of head with baseball, denies loss of consciousness, experiencing dizziness and difficulty focusing, pressure in head. EXAM: CT HEAD WITHOUT CONTRAST TECHNIQUE: Contiguous axial images were obtained from the base of the skull through the vertex without intravenous contrast. COMPARISON:  No pertinent prior exams are available for comparison. FINDINGS: Brain: Cerebral volume is normal. There is no acute intracranial hemorrhage. No demarcated cortical infarct. No extra-axial fluid collection. No evidence of intracranial mass. No midline shift. Vascular: No hyperdense vessel. Skull: Normal. Negative for fracture or focal lesion. Sinuses/Orbits: Visualized orbits show no acute finding. No significant paranasal sinus disease or mastoid effusion at the imaged levels. IMPRESSION: Unremarkable non-contrast CT appearance of the brain. No  evidence of acute intracranial abnormality. Electronically Signed   By: 08/18/2019 DO   On: 08/16/2019 16:28    Procedures Procedures (including critical care time)  Medications Ordered in ED Medications - No data to display  ED Course  I have reviewed the triage vital signs and the nursing notes.  Pertinent labs & imaging results that were available during my care of the patient were reviewed by  me and considered in my medical decision making (see chart for details).    MDM Rules/Calculators/A&P                         Patient is a 34 year old woman who presents today for evaluation of a head injury.  Last night she was hit in the right side of the head with a baseball at a baseball game.  CT was obtained which is negative for acute intracranial hemorrhage or other abnormalities.  On my exam she is neurovascularly intact, able to ambulate without difficulty however she does report feeling slightly off balance.  Suspect concussion.  She is given concussion precautions and instructions.  She is given outpatient follow-up with the concussion clinic.  Return precautions were discussed with patient who states their understanding.  At the time of discharge patient denied any unaddressed complaints or concerns.  Patient is agreeable for discharge home.  Note: Portions of this report may have been transcribed using voice recognition software. Every effort was made to ensure accuracy; however, inadvertent computerized transcription errors may be present   Final Clinical Impression(s) / ED Diagnoses Final diagnoses:  Injury of head, initial encounter    Rx / DC Orders ED Discharge Orders    None       Cristina Gong, PA-C 08/17/19 0160    Pricilla Loveless, MD 08/17/19 [phone]

## 2019-09-02 NOTE — Progress Notes (Signed)
34 y.o. G32P0020 Married Declined Not Hispanic or Latino female here for annual exam. Patient would like to talk about anxiety medication.  She has had issues with anxiety for years. Sometimes feels overwhelmed, tends to clench her jaw, holds her breath. No panic attacks. Her whole family is on Xanax.   She has a kyleena IUD, placed in 11/19. No cycles, occasional spotting. No dyspareunia.   She was hit in the head at a [employer] game on 08/16/19, had a mild concussion.     No LMP recorded. (Menstrual status: IUD).          Sexually active: Yes.    The current method of family planning is IUD.    Exercising: Yes.    Walking light weights Smoker:  no  Health Maintenance: Pap:  07/14/17 WNL HR HPV Neg, 07/10/15 WNL Hr HPV neg, 05/20/13 negative History of abnormal Pap:  Yes, h/o LSIL  In 2010 MMG:  None  BMD:   None  Colonoscopy: never  TDaP:  05/11/10 Gardasil: completed X1   reports that she has quit smoking. She quit smokeless tobacco use about 13 years ago. She reports current alcohol use of about 2.0 standard drinks of alcohol per week. 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   Past Medical History:  Diagnosis Date  . 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 STD (sexually transmitted disease)    condyloma    Past Surgical History:  Procedure Laterality Date  . COLPOSCOPY  02/10/09   CIN1  . INTRAUTERINE DEVICE (IUD) INSERTION     skyla inserted 01/02/12, removed & re-inserted 12-08-14    Current Outpatient Medications  Medication Sig Dispense Refill  . Ascorbic Acid (VITAMIN C PO) Take by mouth daily.    Marland Kitchen BIOTIN PO Take by mouth daily.    . cetirizine (ZYRTEC) 10 MG tablet Take 10 mg by mouth daily.    . Levonorgestrel (KYLEENA) 19.5 MG IUD by Intrauterine route.    . tretinoin (RETIN-A) 0.025 % cream Apply 1 application topically as needed.    . TURMERIC PO Take by mouth daily.     No current facility-administered medications for this visit.    Family History   Problem Relation Age of Onset  . Cancer Mother        cervical  . Hypertension Father   . Stroke Paternal Grandmother     Review of Systems  Psychiatric/Behavioral: The patient is nervous/anxious.   All other systems reviewed and are negative.   Exam:  BP 110/64, P 78,  158 lbs , 5'7" There were no vitals taken for this visit.  Weight change: @WEIGHTCHANGE @ Height:      Ht Readings from Last 3 Encounters:  08/16/19 5\' 7"  (1.702 m)  07/24/18 5\' 7"  (1.702 m)  12/29/17 5' 7.5" (1.715 m)    General appearance: alert, cooperative and appears stated age Head: Normocephalic, without obvious abnormality, atraumatic Neck: no adenopathy, supple, symmetrical, trachea midline and thyroid normal to inspection and palpation Lungs: clear to auscultation bilaterally Cardiovascular: regular rate and rhythm Breasts: normal appearance, no masses or tenderness Abdomen: soft, non-tender; non distended,  no masses,  no organomegaly Extremities: extremities normal, atraumatic, no cyanosis or edema Skin: Skin color, texture, turgor normal. No rashes or lesions Lymph nodes: Cervical, supraclavicular, and axillary nodes normal. No abnormal inguinal nodes palpated Neurologic: Grossly normal   Pelvic: External genitalia:  no lesions  Urethra:  normal appearing urethra with no masses, tenderness or lesions              Bartholins and Skenes: normal                 Vagina: normal appearing vagina with normal color and discharge, no lesions              Cervix: no lesions and IUD string 3-4 cm               Bimanual Exam:  Uterus:  normal size, contour, position, consistency, mobility, non-tender and anteverted              Adnexa: no mass, fullness, tenderness               Rectovaginal: Confirms               Anus:  normal sphincter tone, no lesions  Carolynn Serve chaperoned for the exam.  A:  Well Woman with normal exam  Anxiety, discussed medication and therapy. Discussed the option  of trying an SSRI. She will need to see primary care if she wants Xanax.   P:   No pap this year  Labs through work  TDAP up to date  Gardasil, 2nd one day  Start Celexa, f/u in one month  She will also speak with a counselor  Discussed breast self exam  Discussed calcium and vit D intake

## 2019-09-04 ENCOUNTER — Other Ambulatory Visit: Payer: Self-pay

## 2019-09-04 ENCOUNTER — Ambulatory Visit (INDEPENDENT_AMBULATORY_CARE_PROVIDER_SITE_OTHER): Payer: No Typology Code available for payment source | Admitting: Obstetrics and Gynecology

## 2019-09-04 ENCOUNTER — Encounter: Payer: Self-pay | Admitting: Obstetrics and Gynecology

## 2019-09-04 ENCOUNTER — Telehealth: Payer: Self-pay

## 2019-09-04 VITALS — BP 110/64 | HR 78 | Ht 67.0 in | Wt 158.7 lb

## 2019-09-04 DIAGNOSIS — Z23 Encounter for immunization: Secondary | ICD-10-CM

## 2019-09-04 DIAGNOSIS — F419 Anxiety disorder, unspecified: Secondary | ICD-10-CM

## 2019-09-04 DIAGNOSIS — Z30431 Encounter for routine checking of intrauterine contraceptive device: Secondary | ICD-10-CM

## 2019-09-04 DIAGNOSIS — Z7189 Other specified counseling: Secondary | ICD-10-CM | POA: Diagnosis not present

## 2019-09-04 DIAGNOSIS — Z01419 Encounter for gynecological examination (general) (routine) without abnormal findings: Secondary | ICD-10-CM

## 2019-09-04 DIAGNOSIS — Z7185 Encounter for immunization safety counseling: Secondary | ICD-10-CM

## 2019-09-04 MED ORDER — CITALOPRAM HYDROBROMIDE 20 MG PO TABS: ORAL_TABLET | ORAL | 1 refills | Status: AC

## 2019-09-04 NOTE — Telephone Encounter (Signed)
Patient is needed to return in about "4 weeks (around 10/02/2019) for f/u anxiety (virtual visit is fine)." Patient is scheduled 2 months out on (11/04/19), patient stated she is okay with this date if the doctor is okay with date as well.

## 2019-09-04 NOTE — Telephone Encounter (Signed)
Left detailed message per DPR. Pt to return call with any questions or concerns with needing earlier appt. Pt has scheduled follow up video visit on 11/04/19. Encounter closed.

## 2019-09-04 NOTE — Patient Instructions (Addendum)
EXERCISE AND DIET:  We recommended that you start or continue a regular exercise program for good health. Regular exercise means any activity that makes your heart beat faster and makes you sweat.  We recommend exercising at least 30 minutes per day at least 3 days a week, preferably 4 or 5.  We also recommend a diet low in fat and sugar.  Inactivity, poor dietary choices and obesity can cause diabetes, heart attack, stroke, and kidney damage, among others.    ALCOHOL AND SMOKING:  Women should limit their alcohol intake to no more than 7 drinks/beers/glasses of wine (combined, not each!) per week. Moderation of alcohol intake to this level decreases your risk of breast cancer and liver damage. And of course, no recreational drugs are part of a healthy lifestyle.  And absolutely no smoking or even second hand smoke. Most people know smoking can cause heart and lung diseases, but did you know it also contributes to weakening of your bones? Aging of your skin?  Yellowing of your teeth and nails?  CALCIUM AND VITAMIN D:  Adequate intake of calcium and Vitamin D are recommended.  The recommendations for exact amounts of these supplements seem to change often, but generally speaking 1,000 mg of calcium (between diet and supplement) and 800 units of Vitamin D per day seems prudent. Certain women may benefit from higher intake of Vitamin D.  If you are among these women, your doctor will have told you during your visit.    PAP SMEARS:  Pap smears, to check for cervical cancer or precancers,  have traditionally been done yearly, although recent scientific advances have shown that most women can have pap smears less often.  However, every woman still should have a physical exam from her gynecologist every year. It will include a breast check, inspection of the vulva and vagina to check for abnormal growths or skin changes, a visual exam of the cervix, and then an exam to evaluate the size and shape of the uterus and  ovaries.  And after 35 years of age, a rectal exam is indicated to check for rectal cancers. We will also provide age appropriate advice regarding health maintenance, like when you should have certain vaccines, screening for sexually transmitted diseases, bone density testing, colonoscopy, mammograms, etc.   MAMMOGRAMS:  All women over 40 years old should have a yearly mammogram. Many facilities now offer a "3D" mammogram, which may cost around $50 extra out of pocket. If possible,  we recommend you accept the option to have the 3D mammogram performed.  It both reduces the number of women who will be called back for extra views which then turn out to be normal, and it is better than the routine mammogram at detecting truly abnormal areas.    COLON CANCER SCREENING: Now recommend starting at age 45. At this time colonoscopy is not covered for routine screening until 50. There are take home tests that can be done between 45-49.   COLONOSCOPY:  Colonoscopy to screen for colon cancer is recommended for all women at age 50.  We know, you hate the idea of the prep.  We agree, BUT, having colon cancer and not knowing it is worse!!  Colon cancer so often starts as a polyp that can be seen and removed at colonscopy, which can quite literally save your life!  And if your first colonoscopy is normal and you have no family history of colon cancer, most women don't have to have it again for   10 years.  Once every ten years, you can do something that may end up saving your life, right?  We will be happy to help you get it scheduled when you are ready.  Be sure to check your insurance coverage so you understand how much it will cost.  It may be covered as a preventative service at no cost, but you should check your particular policy.      Breast Self-Awareness Breast self-awareness means being familiar with how your breasts look and feel. It involves checking your breasts regularly and reporting any changes to your  health care provider. Practicing breast self-awareness is important. A change in your breasts can be a sign of a serious medical problem. Being familiar with how your breasts look and feel allows you to find any problems early, when treatment is more likely to be successful. All women should practice breast self-awareness, including women who have had breast implants. How to do a breast self-exam One way to learn what is normal for your breasts and whether your breasts are changing is to do a breast self-exam. To do a breast self-exam: Look for Changes  1. Remove all the clothing above your waist. 2. Stand in front of a mirror in a room with good lighting. 3. Put your hands on your hips. 4. Push your hands firmly downward. 5. Compare your breasts in the mirror. Look for differences between them (asymmetry), such as: ? Differences in shape. ? Differences in size. ? Puckers, dips, and bumps in one breast and not the other. 6. Look at each breast for changes in your skin, such as: ? Redness. ? Scaly areas. 7. Look for changes in your nipples, such as: ? Discharge. ? Bleeding. ? Dimpling. ? Redness. ? A change in position. Feel for Changes Carefully feel your breasts for lumps and changes. It is best to do this while lying on your back on the floor and again while sitting or standing in the shower or tub with soapy water on your skin. Feel each breast in the following way:  Place the arm on the side of the breast you are examining above your head.  Feel your breast with the other hand.  Start in the nipple area and make  inch (2 cm) overlapping circles to feel your breast. Use the pads of your three middle fingers to do this. Apply light pressure, then medium pressure, then firm pressure. The light pressure will allow you to feel the tissue closest to the skin. The medium pressure will allow you to feel the tissue that is a little deeper. The firm pressure will allow you to feel the tissue  close to the ribs.  Continue the overlapping circles, moving downward over the breast until you feel your ribs below your breast.  Move one finger-width toward the center of the body. Continue to use the  inch (2 cm) overlapping circles to feel your breast as you move slowly up toward your collarbone.  Continue the up and down exam using all three pressures until you reach your armpit.  Write Down What You Find  Write down what is normal for each breast and any changes that you find. Keep a written record with breast changes or normal findings for each breast. By writing this information down, you do not need to depend only on memory for size, tenderness, or location. Write down where you are in your menstrual cycle, if you are still menstruating. If you are having trouble noticing differences   in your breasts, do not get discouraged. With time you will become more familiar with the variations in your breasts and more comfortable with the exam. How often should I examine my breasts? Examine your breasts every month. If you are breastfeeding, the best time to examine your breasts is after a feeding or after using a breast pump. If you menstruate, the best time to examine your breasts is 5-7 days after your period is over. During your period, your breasts are lumpier, and it may be more difficult to notice changes. When should I see my health care provider? See your health care provider if you notice:  A change in shape or size of your breasts or nipples.  A change in the skin of your breast or nipples, such as a reddened or scaly area.  Unusual discharge from your nipples.  A lump or thick area that was not there before.  Pain in your breasts.  Anything that concerns you.   Managing Anxiety, Adult After being diagnosed with an anxiety disorder, you may be relieved to know why you have felt or behaved a certain way. You may also feel overwhelmed about the treatment ahead and what it will  mean for your life. With care and support, you can manage this condition and recover from it. How to manage lifestyle changes Managing stress and anxiety  Stress is your body's reaction to life changes and events, both good and bad. Most stress will last just a few hours, but stress can be ongoing and can lead to more than just stress. Although stress can play a major role in anxiety, it is not the same as anxiety. Stress is usually caused by something external, such as a deadline, test, or competition. Stress normally passes after the triggering event has ended.  Anxiety is caused by something internal, such as imagining a terrible outcome or worrying that something will go wrong that will devastate you. Anxiety often does not go away even after the triggering event is over, and it can become long-term (chronic) worry. It is important to understand the differences between stress and anxiety and to manage your stress effectively so that it does not lead to an anxious response. Talk with your health care provider or a counselor to learn more about reducing anxiety and stress. He or she may suggest tension reduction techniques, such as:  Music therapy. This can include creating or listening to music that you enjoy and that inspires you.  Mindfulness-based meditation. This involves being aware of your normal breaths while not trying to control your breathing. It can be done while sitting or walking.  Centering prayer. This involves focusing on a word, phrase, or sacred image that means something to you and brings you peace.  Deep breathing. To do this, expand your stomach and inhale slowly through your nose. Hold your breath for 3-5 seconds. Then exhale slowly, letting your stomach muscles relax.  Self-talk. This involves identifying thought patterns that lead to anxiety reactions and changing those patterns.  Muscle relaxation. This involves tensing muscles and then relaxing them. Choose a tension  reduction technique that suits your lifestyle and personality. These techniques take time and practice. Set aside 5-15 minutes a day to do them. Therapists can offer counseling and training in these techniques. The training to help with anxiety may be covered by some insurance plans. Other things you can do to manage stress and anxiety include:  Keeping a stress/anxiety diary. This can help you learn what triggers  your reaction and then learn ways to manage your response.  Thinking about how you react to certain situations. You may not be able to control everything, but you can control your response.  Making time for activities that help you relax and not feeling guilty about spending your time in this way.  Visual imagery and yoga can help you stay calm and relax.  Medicines Medicines can help ease symptoms. Medicines for anxiety include:  Anti-anxiety drugs.  Antidepressants. Medicines are often used as a primary treatment for anxiety disorder. Medicines will be prescribed by a health care provider. When used together, medicines, psychotherapy, and tension reduction techniques may be the most effective treatment. Relationships Relationships can play a big part in helping you recover. Try to spend more time connecting with trusted friends and family members. Consider going to couples counseling, taking family education classes, or going to family therapy. Therapy can help you and others better understand your condition. How to recognize changes in your anxiety Everyone responds differently to treatment for anxiety. Recovery from anxiety happens when symptoms decrease and stop interfering with your daily activities at home or work. This may mean that you will start to:  Have better concentration and focus. Worry will interfere less in your daily thinking.  Sleep better.  Be less irritable.  Have more energy.  Have improved memory. It is important to recognize when your condition is  getting worse. Contact your health care provider if your symptoms interfere with home or work and you feel like your condition is not improving. Follow these instructions at home: Activity  Exercise. Most adults should do the following: ? Exercise for at least 150 minutes each week. The exercise should increase your heart rate and make you sweat (moderate-intensity exercise). ? Strengthening exercises at least twice a week.  Get the right amount and quality of sleep. Most adults need 7-9 hours of sleep each night. Lifestyle   Eat a healthy diet that includes plenty of vegetables, fruits, whole grains, low-fat dairy products, and lean protein. Do not eat a lot of foods that are high in solid fats, added sugars, or salt.  Make choices that simplify your life.  Do not use any products that contain nicotine or tobacco, such as cigarettes, e-cigarettes, and chewing tobacco. If you need help quitting, ask your health care provider.  Avoid caffeine, alcohol, and certain over-the-counter cold medicines. These may make you feel worse. Ask your pharmacist which medicines to avoid. General instructions  Take over-the-counter and prescription medicines only as told by your health care provider.  Keep all follow-up visits as told by your health care provider. This is important. Where to find support You can get help and support from these sources:  Self-help groups.  Online and [employer].  A trusted spiritual leader.  Couples counseling.  Family education classes.  Family therapy. Where to find more information You may find that joining a support group helps you deal with your anxiety. The following sources can help you locate counselors or support groups near you:  Mental Health America: www.mentalhealthamerica.net  Anxiety and Depression Association of [place] (ADAA): ProgramCam.de  [employer] on Mental Illness (NAMI): www.nami.org Contact a health care provider  if you:  Have a hard time staying focused or finishing daily tasks.  Spend many hours a day feeling worried about everyday life.  Become exhausted by worry.  Start to have headaches, feel tense, or have nausea.  Urinate more than normal.  Have diarrhea. Get help right  away if you have:  A racing heart and shortness of breath.  Thoughts of hurting yourself or others. If you ever feel like you may hurt yourself or others, or have thoughts about taking your own life, get help right away. You can go to your nearest emergency department or call:  Your local emergency services (911 in the [place]  A suicide crisis helpline, such as the National Suicide Prevention Lifeline at 848-220-9928. This is open 24 hours a day. Summary  Taking steps to learn and use tension reduction techniques can help calm you and help prevent triggering an anxiety reaction.  When used together, medicines, psychotherapy, and tension reduction techniques may be the most effective treatment.  Family, friends, and partners can play a big part in helping you recover from an anxiety disorder. This information is not intended to replace advice given to you by your health care provider. Make sure you discuss any questions you have with your health care provider. Document Revised: 06/05/2018 Document Reviewed: 06/05/2018 Elsevier Patient Education  2020 [employer].

## 2019-11-04 ENCOUNTER — Telehealth: Payer: No Typology Code available for payment source | Admitting: Obstetrics and Gynecology

## 2019-11-05 ENCOUNTER — Encounter: Payer: Self-pay | Admitting: Obstetrics and Gynecology

## 2019-11-05 ENCOUNTER — Telehealth (INDEPENDENT_AMBULATORY_CARE_PROVIDER_SITE_OTHER): Payer: No Typology Code available for payment source | Admitting: Obstetrics and Gynecology

## 2019-11-05 ENCOUNTER — Other Ambulatory Visit: Payer: Self-pay

## 2019-11-05 DIAGNOSIS — F419 Anxiety disorder, unspecified: Secondary | ICD-10-CM

## 2019-11-05 NOTE — Progress Notes (Signed)
Virtual Visit via Video Note  I connected with Kristi Fuller on 11/05/19 at  4:00 PM EDT by a video enabled telemedicine application and verified that I am speaking with the correct person using two identifiers.  Location: Patient: Home Provider: Office at [facility]   I discussed the limitations of evaluation and management by telemedicine and the availability of in person appointments. The patient expressed understanding and agreed to proceed.  GYNECOLOGY  VISIT   HPI: 34 y.o.   Married Declined Not Hispanic or Latino  female   [id number] with No LMP recorded. (Menstrual status: IUD).   here to follow up on anxiety and Celexa. She was started on Celexa on 08/18/19 at her annual exam. She took the Celexa for about a week, she felt weird when she took it. Felt foggy, she never got past 1/2 a dose. Almost had a tingling sensation.  Her anxiety is specific to certain situations, ie travel, driving, or a work deadline. She really would prefer not to take a daily medication.   GYNECOLOGIC HISTORY: No LMP recorded. (Menstrual status: IUD). Contraception: kyleena Menopausal hormone therapy: NA        OB History    Gravida  2   Para  0   Term  0   Preterm      AB  2   Living  0     SAB      TAB  2   Ectopic      Multiple      Live Births                 Patient Active Problem List   Diagnosis Date Noted  . Acute bilateral low back pain without sciatica 08/19/2016  . Costochondritis 07/02/2014    Past Medical History:  Diagnosis Date  . 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 STD (sexually transmitted disease)    condyloma    Past Surgical History:  Procedure Laterality Date  . COLPOSCOPY  02/10/09   CIN1  . INTRAUTERINE DEVICE (IUD) INSERTION     skyla inserted 01/02/12, removed & re-inserted 12-08-14    Current Outpatient Medications  Medication Sig Dispense Refill  . Ascorbic Acid (VITAMIN C PO) Take by mouth daily.    Marland Kitchen BIOTIN PO Take  by mouth daily.    . cetirizine (ZYRTEC) 10 MG tablet Take 10 mg by mouth daily.    . citalopram (CELEXA) 20 MG tablet Take 1/2 a tablet a day for one week, if tolerating then increase to 1 tablet a day. 30 tablet 1  . Levonorgestrel (KYLEENA) 19.5 MG IUD by Intrauterine route.    . tretinoin (RETIN-A) 0.025 % cream Apply 1 application topically as needed.    . TURMERIC PO Take by mouth daily.     No current facility-administered medications for this visit.     ALLERGIES: Amoxicillin  Family History  Problem Relation Age of Onset  . Cancer Mother        cervical  . Hypertension Father   . Stroke Paternal Grandmother     Social History   Socioeconomic History  . Marital status: Married    Spouse name: Greta Doom  . Number of children: 0  . Years of education: Not on file  . Highest education level: Not on file  Occupational History  . Occupation: tax Airline pilot  Tobacco Use  . Smoking status: Former Games developer  . Smokeless tobacco: Former Neurosurgeon    Quit date:  12/06/2005  Vaping Use  . Vaping Use: Never used  Substance and Sexual Activity  . Alcohol use: Yes    Alcohol/week: 2.0 standard drinks    Types: 2 Glasses of wine per week    Comment: daily  . Drug use: No  . Sexual activity: Yes    Partners: Male    Birth control/protection: I.U.D.  Other Topics Concern  . Not on file  Social History Narrative   Lives with her husband   Social Determinants of Health   Financial Resource Strain:   . Difficulty of Paying Living Expenses: Not on file  Food Insecurity:   . Worried About Programme researcher, broadcasting/film/video in the Last Year: Not on file  . Ran Out of Food in the Last Year: Not on file  Transportation Needs:   . Lack of Transportation (Medical): Not on file  . Lack of Transportation (Non-Medical): Not on file  Physical Activity:   . Days of Exercise per Week: Not on file  . Minutes of Exercise per Session: Not on file  Stress:   . Feeling of Stress : Not on file  Social  Connections:   . Frequency of Communication with Friends and Family: Not on file  . Frequency of Social Gatherings with Friends and Family: Not on file  . Attends Religious Services: Not on file  . Active Member of Clubs or Organizations: Not on file  . Attends Banker Meetings: Not on file  . Marital Status: Not on file  Intimate Partner Violence:   . Fear of Current or Ex-Partner: Not on file  . Emotionally Abused: Not on file  . Physically Abused: Not on file  . Sexually Abused: Not on file    ROS  PHYSICAL EXAMINATION:    There were no vitals taken for this visit.    General appearance: alert, cooperative and appears stated age  ASSESSMENT Anxiety, didn't like the Celexa, stopped after 1 week    PLAN Discussed options of trying a different SSRI, buspar, propanol prn or a benzodiazepine prn. I told her that I don't typically prescribe benzodiazepine's. She will f/u with her primary for further discussion of prn medications options.

## 2019-11-05 NOTE — Patient Instructions (Signed)

## 2022-06-17 IMAGING — CT CT HEAD W/O CM
3 series · 16 of 47 positions shown, 19 images · non-contrast
Comparison: No pertinent prior exams are available for comparison.

CLINICAL DATA: Head trauma, moderate/severe. Additional history
provided: Patient reports head injury yesterday hitting right side
of head with baseball, denies loss of consciousness, experiencing
dizziness and difficulty focusing, pressure in head.

EXAM:
CT HEAD WITHOUT CONTRAST
TECHNIQUE: Contiguous axial images were obtained from the base of the skull
through the vertex without intravenous contrast.

[Series 2: head wo · axial · 0.49mm/px · z∈[-162,-37]mm · 10 of 31 slices shown, 13 images]
[im 3/31  brain]
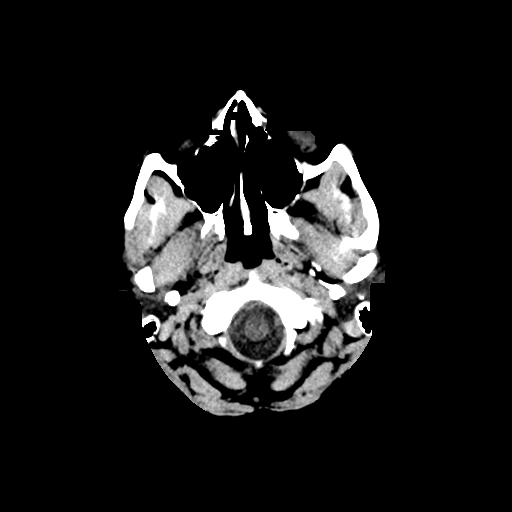
[im 3/31  bone]
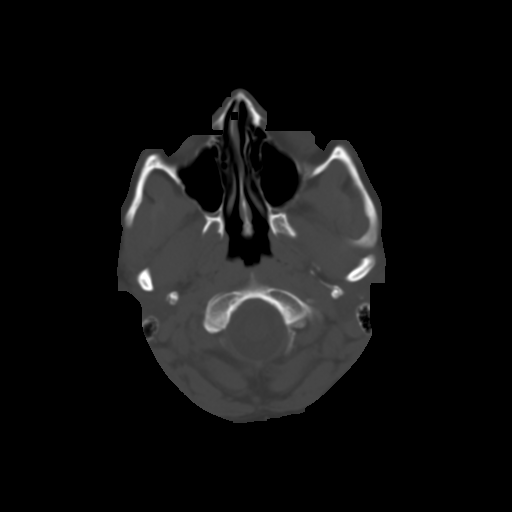
[im 6/31  brain]
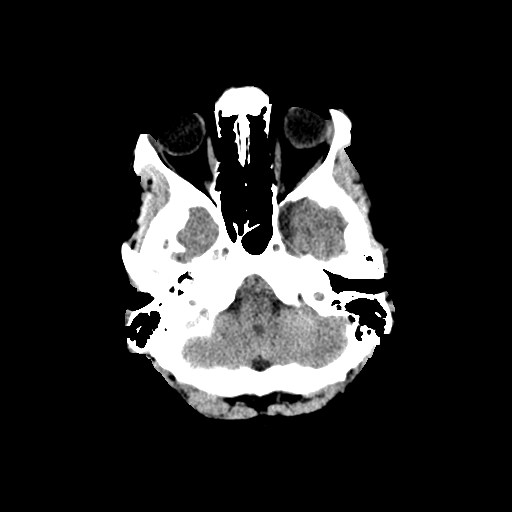
[im 9/31  brain]
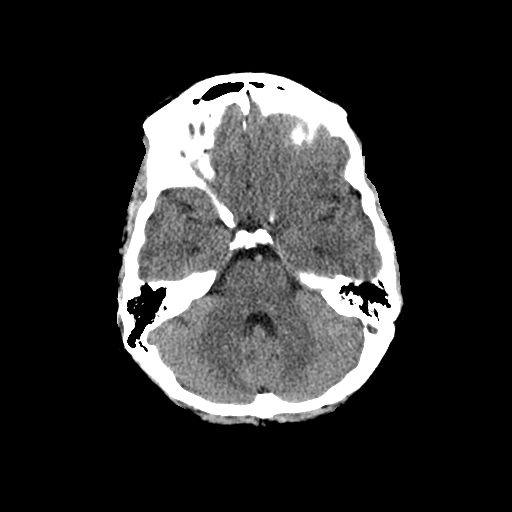
[im 11/31  brain]
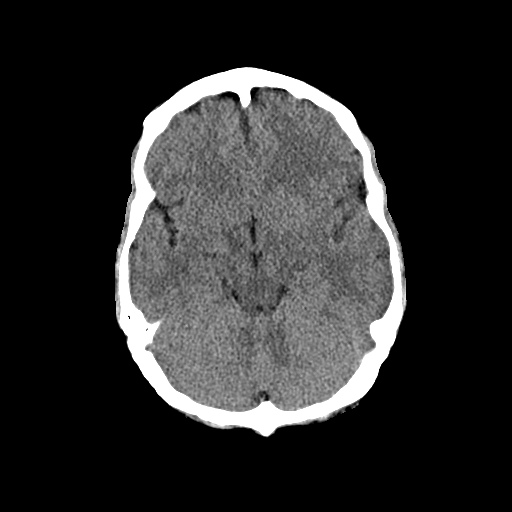
[im 14/31  brain]
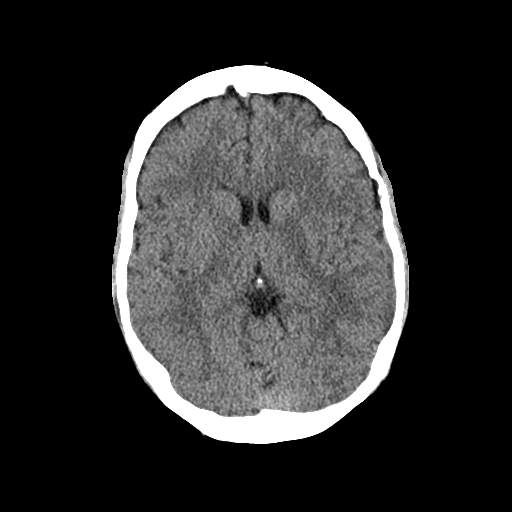
[im 14/31  bone]
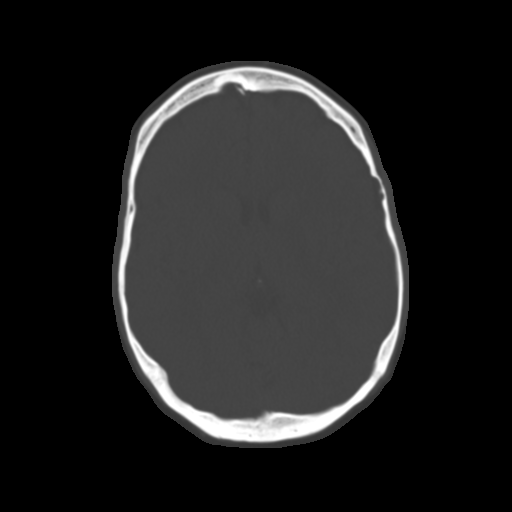
[im 17/31  brain]
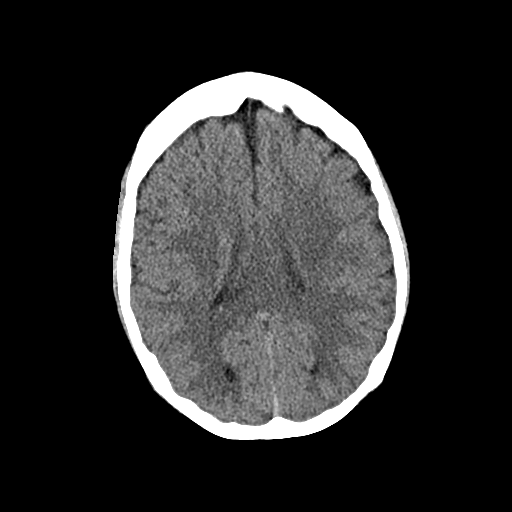
[im 20/31  brain]
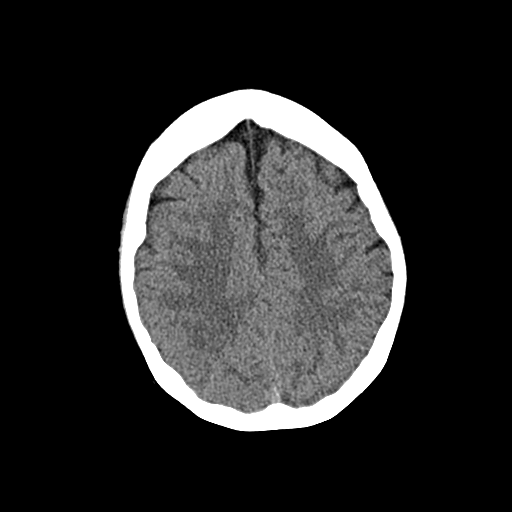
[im 23/31  brain]
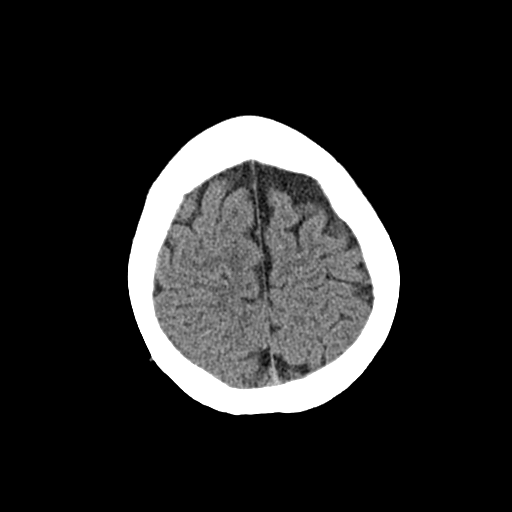
[im 25/31  brain]
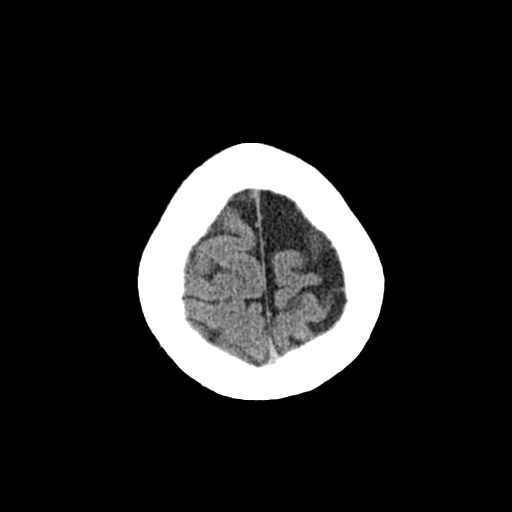
[im 25/31  bone]
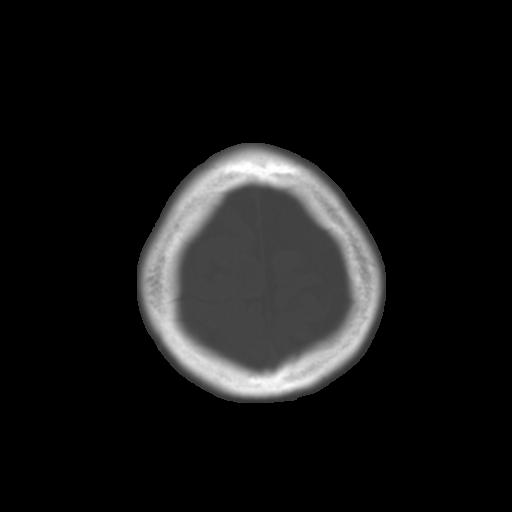
[im 28/31  brain]
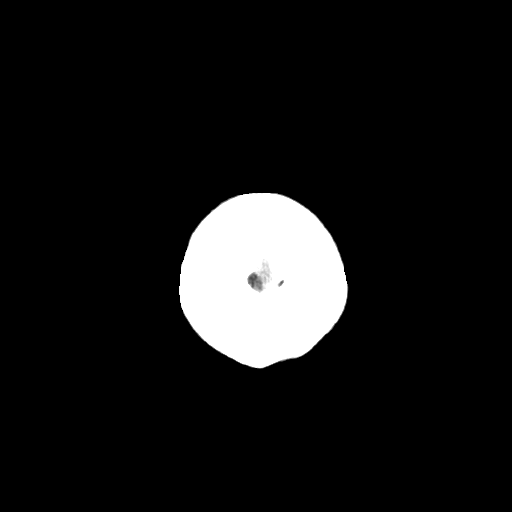

[Series 4: cor soft · coronal · 0.38mm/px · 3 of 63 slices shown]
[im 21/63  brain]
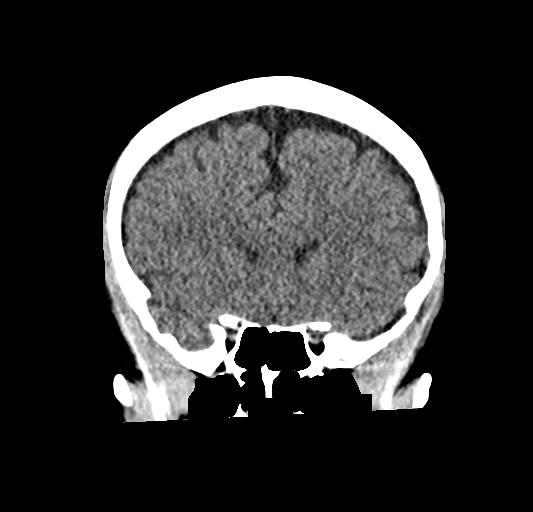
[im 28/63  brain]
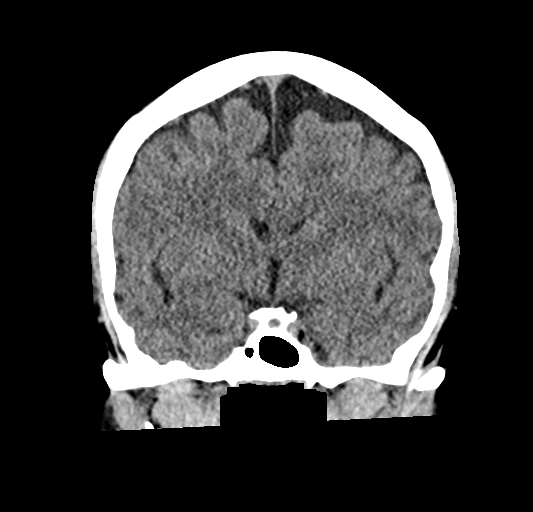
[im 35/63  brain]
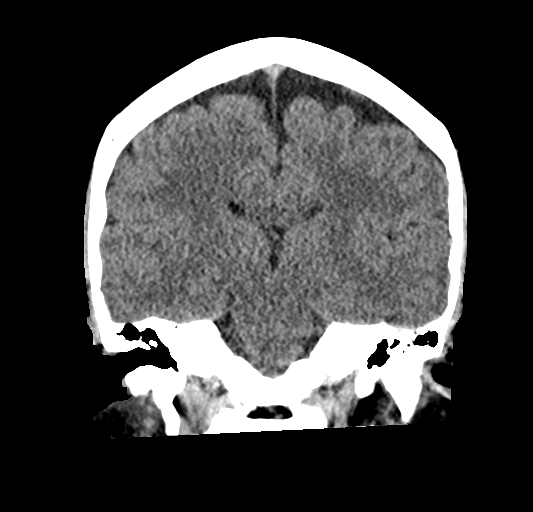

[Series 5: sag soft · sagittal · 0.37mm/px · 3 of 58 slices shown]
[im 20/58  brain]
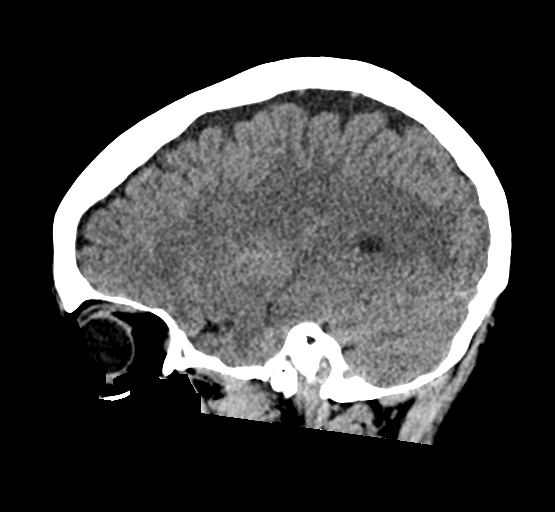
[im 29/58  brain]
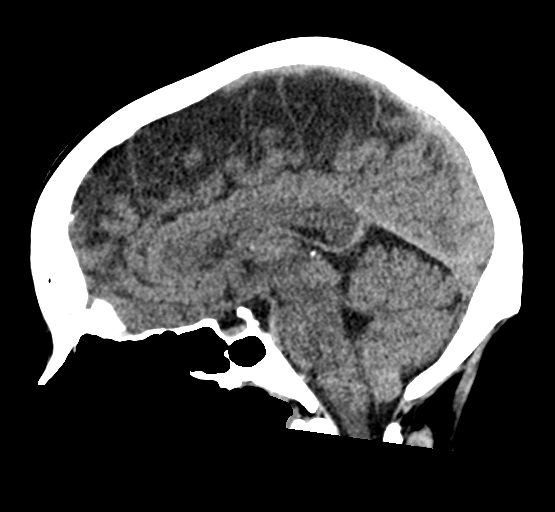
[im 39/58  brain]
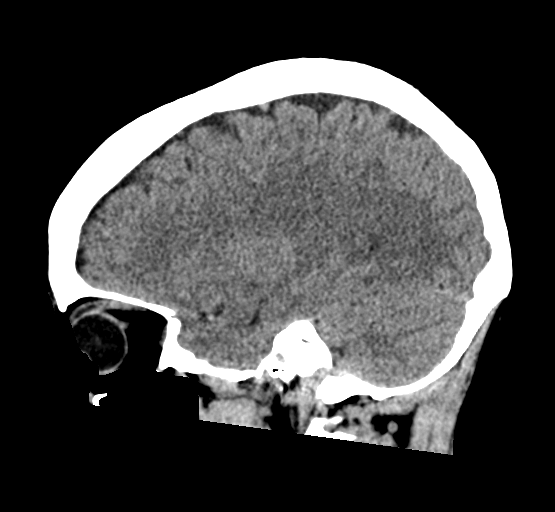

[16 of 47 positions shown; findings below may reference images not displayed]

FINDINGS: Brain:

Cerebral volume is normal.

There is no acute intracranial hemorrhage.

No demarcated cortical infarct.

No extra-axial fluid collection.

No evidence of intracranial mass.

No midline shift.

Vascular: No hyperdense vessel.

Skull: Normal. Negative for fracture or focal lesion.

Sinuses/Orbits: Visualized orbits show no acute finding. No
significant paranasal sinus disease or mastoid effusion at the
imaged levels.
IMPRESSION: Unremarkable non-contrast CT appearance of the brain. No evidence of
acute intracranial abnormality.
